# Patient Record
Sex: Female | Born: 1995 | Race: White | Hispanic: No | Smoking: Current every day smoker
Health system: Southern US, Community
[De-identification: ages and names within clinical notes are randomized; demographics above are authoritative.]

## PROBLEM LIST (undated history)

## (undated) DIAGNOSIS — N809 Endometriosis, unspecified: Secondary | ICD-10-CM

## (undated) DIAGNOSIS — N808 Other endometriosis: Secondary | ICD-10-CM

## (undated) DIAGNOSIS — N80A0 Endometriosis of bladder, unspecified depth: Secondary | ICD-10-CM

## (undated) DIAGNOSIS — J45909 Unspecified asthma, uncomplicated: Secondary | ICD-10-CM

## (undated) HISTORY — PX: ABDOMINAL HYSTERECTOMY: SHX81

## (undated) HISTORY — PX: APPENDECTOMY: SHX54

---
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
## 2016-02-06 DIAGNOSIS — R339 Retention of urine, unspecified: Secondary | ICD-10-CM | POA: Insufficient documentation

## 2017-07-14 DIAGNOSIS — E063 Autoimmune thyroiditis: Secondary | ICD-10-CM | POA: Insufficient documentation

## 2017-08-14 ENCOUNTER — Other Ambulatory Visit: Payer: Self-pay

## 2017-08-14 ENCOUNTER — Encounter: Payer: Self-pay | Admitting: Emergency Medicine

## 2017-08-14 ENCOUNTER — Emergency Department
Admission: EM | Admit: 2017-08-14 | Discharge: 2017-08-15 | Disposition: A | Discharge status: Home / Self Care | Payer: No Typology Code available for payment source | Attending: Emergency Medicine | Admitting: Emergency Medicine

## 2017-08-14 DIAGNOSIS — Z7721 Contact with and (suspected) exposure to potentially hazardous body fluids: Secondary | ICD-10-CM | POA: Insufficient documentation

## 2017-08-14 DIAGNOSIS — W273XXA Contact with needle (sewing), initial encounter: Secondary | ICD-10-CM

## 2017-08-14 DIAGNOSIS — W461XXA Contact with contaminated hypodermic needle, initial encounter: Secondary | ICD-10-CM | POA: Diagnosis not present

## 2017-08-14 DIAGNOSIS — S61031A Puncture wound without foreign body of right thumb without damage to nail, initial encounter: Secondary | ICD-10-CM | POA: Insufficient documentation

## 2017-08-14 DIAGNOSIS — Y990 Civilian activity done for income or pay: Secondary | ICD-10-CM | POA: Insufficient documentation

## 2017-08-14 DIAGNOSIS — F17210 Nicotine dependence, cigarettes, uncomplicated: Secondary | ICD-10-CM | POA: Insufficient documentation

## 2017-08-14 DIAGNOSIS — Y929 Unspecified place or not applicable: Secondary | ICD-10-CM | POA: Diagnosis not present

## 2017-08-14 DIAGNOSIS — S61239A Puncture wound without foreign body of unspecified finger without damage to nail, initial encounter: Secondary | ICD-10-CM

## 2017-08-14 DIAGNOSIS — Y93F9 Activity, other caregiving: Secondary | ICD-10-CM | POA: Insufficient documentation

## 2017-08-14 DIAGNOSIS — IMO0001 Reserved for inherently not codable concepts without codable children: Secondary | ICD-10-CM

## 2017-08-14 HISTORY — DX: Endometriosis, unspecified: N80.9

## 2017-08-14 NOTE — ED Triage Notes (Signed)
Pt arrives via [facility] for a right thumb stick. Pt was starting an IV and got her thumb. No visible puncture is noted to thumb at this time. Pt is in NAD.

## 2017-08-14 NOTE — Discharge Instructions (Addendum)
As we discussed, your occupational needle stick was low risk for infectious disease transmission.  We sent blood work both for you and the "source" patient according to protocol, and you declined prophylactic medication which I think is appropriate under these particular circumstances.  Please follow-up with your occupational health team as indicated by protocol.

## 2017-08-14 NOTE — ED Provider Notes (Signed)
[facility] Emergency Department Provider Note  ____________________________________________   First MD Initiated Contact with Patient 08/14/17 2301     (approximate)  I have reviewed the triage vital signs and the nursing notes.   HISTORY  Chief Complaint Body Fluid Exposure    HPI Autumn Bass is a 22 y.o. 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  The source packet for post exposure evaluation has been processed on the other patient; for example, the hepatitis panel is being sent to [facility] as per protocol.  Rapid HIV is pending.  [name] reports that she was in the back of the ambulance with the patient attempting to start an IV when she felt a mild needle prick in her right thumb.  There was no bleeding and there is no visible injury.  She reported to her team and they presented to the ED as per protocol.  She has no pain.  The exposure happened acutely.  The "source" is a healthy and active 22 year old woman who was coming in for evaluation of dizziness with minimal risk factors.  Past Medical History:  Diagnosis Date  . Endometriosis     There are no active problems to display for this patient.   Past Surgical History:  Procedure Laterality Date  . ABDOMINAL HYSTERECTOMY      Prior to Admission medications   Not on File    Allergies Benadryl [diphenhydramine]  No family history on file.  Social History Social History   Tobacco Use  . Smoking status: Current Every Day Smoker  . Smokeless tobacco: Never Used  Substance Use Topics  . Alcohol use: Yes    Frequency: Never  . Drug use: Never    Review of Systems Constitutional: No fever/chills Cardiovascular: Denies chest pain. Respiratory: Denies shortness of breath. Gastrointestinal: No abdominal pain.  No nausea, no vomiting.   Musculoskeletal: Negative for  neck pain.  Negative for back pain. Integumentary: No bleeding or visible injury to right thumb. Neurological: Negative for headaches, focal weakness or numbness.   ____________________________________________   PHYSICAL EXAM:  VITAL SIGNS: ED Triage Vitals  Enc Vitals Group     BP 08/14/17 2205 (!) 104/58     Pulse Rate 08/14/17 2205 77     Resp 08/14/17 2205 18     Temp 08/14/17 2205 97.7 F (36.5 C)     Temp Source 08/14/17 2205 Oral     SpO2 08/14/17 2205 95 %     Weight 08/14/17 2206 59 kg (130 lb)     Height 08/14/17 2206 1.6 m ( )     Head Circumference --      Peak Flow --      Pain Score 08/14/17 2206 0     Pain Loc --      Pain Edu? --      Excl. in GC? --     Constitutional: Alert and oriented. Well appearing and in no acute distress. Eyes: Conjunctivae are normal.  Head: Atraumatic. Cardiovascular: Normal rate, regular rhythm. Good peripheral circulation. Respiratory: Normal respiratory effort.  No retractions.  Musculoskeletal: No lower extremity tenderness nor edema. No gross deformities of extremities. Neurologic:  Normal speech and language. No gross focal neurologic deficits are appreciated.  Skin:  Skin is warm, dry and intact.  I examined her right thumb and I see no evidence of a puncture wound at this  time. Psychiatric: Mood and affect are normal. Speech and behavior are normal.  ____________________________________________   LABS (all labs ordered are listed, but only abnormal results are displayed)  Labs Reviewed - No data to display ____________________________________________  EKG  None - EKG not ordered by ED physician ____________________________________________  RADIOLOGY   ED MD interpretation: No indication for imaging  Official radiology report(s): No results found.  ____________________________________________   PROCEDURES  Critical Care performed: No   Procedure(s) performed:    Procedures   ____________________________________________   INITIAL IMPRESSION / ASSESSMENT AND PLAN / ED COURSE  As part of my medical decision making, I reviewed the following data within the electronic MEDICAL RECORD NUMBER Nursing notes reviewed and incorporated    I reviewed the "exposed" packet.  The patient does not want to start anti-viral medication which I think is very appropriate given the minimal exposure in the low risk source patient.  We proceeded with the blood draw to send to [facility] for HIV and hepatitis panel but did not send the blood work which the packet indicates is only necessary if the patient is starting prophylactic medication (CBC, CMP, urine pregnancy).  The patient is in no distress and after the lab work was finished she was discharged for follow-up with her occupational team.     ____________________________________________  FINAL CLINICAL IMPRESSION(S) / ED DIAGNOSES  Final diagnoses:  Needle stick injury of finger of right hand, initial encounter     MEDICATIONS GIVEN DURING THIS VISIT:  Medications - No data to display   ED Discharge Orders    None       Note:  This document was prepared using Dragon voice recognition software and may include unintentional dictation errors.    Loleta Rose, MD 08/15/17 [phone]

## 2017-10-06 ENCOUNTER — Ambulatory Visit: Payer: Self-pay | Admitting: Family Medicine

## 2017-10-06 VITALS — BP 114/56 | HR 76 | Temp 98.2°F | Resp 16

## 2017-10-06 DIAGNOSIS — B353 Tinea pedis: Secondary | ICD-10-CM

## 2017-10-06 MED ORDER — CLOTRIMAZOLE 1 % EX CREA: TOPICAL_CREAM | CUTANEOUS | 0 refills | Status: DC

## 2017-10-06 NOTE — Progress Notes (Signed)
Subjective: foot rash     Autumn Bass is a 22 y.o. female who presents for new evaluation and treatment of a rash of her left foot. Onset of symptoms was approximately 2 months ago. Patient reports the onset of her symptoms coincides with starting to wear very thick boots at work that trap moisture.  Location of tinea pedis: left foot between fourth and fifth toes. Associated findings include fissures, maceration and scaling.  Endorses a burning sensation.  Patient also reports noting a small lesion to the bottom of her foot 1 week ago that is painful to bear weight on.  Patient denies findings of central clearing of lesions, pruritus, pustules and vesicles. Treatment to date: over the counter antifungal medication.  Patient was using this occasionally as needed.  Patient reports significant improvement in her symptoms to the point where only a dry, scaling lesion was left but that a week ago symptoms worsened when she stopped using the cream prior to full resolution of the rash.  Patient reports a history of tinea pedis in the past, which she had seen podiatry for prior to moving to [place] recently.  Patient denies fever, chills, malaise, or any systemic symptoms.  Patient denies diabetes or any immunosuppressive conditions.  Review of Systems Pertinent items noted in HPI and remainder of comprehensive ROS otherwise negative.    Objective:     Skin:    Mild maceration noted between the fourth and fifth toes, well-defined borders and small superficial fissure.  No erythema, edema, warmth to the touch and the bleeding, odor, or drainage noted.  TTP.  Small red papule measuring approximately half a centimeter to plantar aspect of left foot near the distal aspect of the third metatarsal.  Small area of dry scaling noted to distal aspect. TTP.  Otherwise skin is clean, dry, and intact with no other lesions noted.               Assessment:    Tinea pedis     Plan:   See orders.  Discussed  side/adverse effects. Observe closely for skin damage/changes and contact us if worrisome changes occur.  Verbal patient instruction given. Unable to confirm the diagnosis in our clinic due to lacking diagnostic equipment.  Discussed this with patient.  Patient referred to podiatry to confirm the diagnosis and for follow-up. Discussed ways to keep her feet dry, prevent transmission, and prevent secondary infection. Red flag symptoms and indications to seek medical care discussed.  New Prescriptions   CLOTRIMAZOLE (LOTRIMIN) 1 % CREAM    Apply topically to affected area and to normal skin 2 cm beyond affected area BID until 7 days after symptom resolution, for up to 4 weeks.

## 2017-11-09 ENCOUNTER — Other Ambulatory Visit: Payer: Self-pay

## 2017-11-09 ENCOUNTER — Emergency Department: Payer: Managed Care, Other (non HMO)

## 2017-11-09 ENCOUNTER — Emergency Department
Admission: EM | Admit: 2017-11-09 | Discharge: 2017-11-09 | Disposition: A | Discharge status: Home / Self Care | Payer: Managed Care, Other (non HMO) | Attending: Student in an Organized Health Care Education/Training Program | Admitting: Student in an Organized Health Care Education/Training Program

## 2017-11-09 ENCOUNTER — Encounter: Payer: Self-pay | Admitting: Emergency Medicine

## 2017-11-09 DIAGNOSIS — R300 Dysuria: Secondary | ICD-10-CM

## 2017-11-09 DIAGNOSIS — Z91040 Latex allergy status: Secondary | ICD-10-CM | POA: Diagnosis not present

## 2017-11-09 DIAGNOSIS — F17200 Nicotine dependence, unspecified, uncomplicated: Secondary | ICD-10-CM | POA: Insufficient documentation

## 2017-11-09 DIAGNOSIS — R1031 Right lower quadrant pain: Secondary | ICD-10-CM

## 2017-11-09 HISTORY — DX: Endometriosis of bladder, unspecified depth: N80.A0

## 2017-11-09 HISTORY — DX: Other endometriosis: N80.8

## 2017-11-09 LAB — CBC
HEMATOCRIT: 37.8 % (ref 35.0–47.0)
HEMOGLOBIN: 13.1 g/dL (ref 12.0–16.0)
MCH: 32.3 pg (ref 26.0–34.0)
MCHC: 34.8 g/dL (ref 32.0–36.0)
MCV: 92.7 fL (ref 80.0–100.0)
Platelets: 254 10*3/uL (ref 150–440)
RBC: 4.08 MIL/uL (ref 3.80–5.20)
RDW: 12.9 % (ref 11.5–14.5)
WBC: 7.4 10*3/uL (ref 3.6–11.0)

## 2017-11-09 LAB — POCT PREGNANCY, URINE: PREG TEST UR: NEGATIVE

## 2017-11-09 LAB — BASIC METABOLIC PANEL
ANION GAP: 6 (ref 5–15)
BUN: 14 mg/dL (ref 6–20)
CALCIUM: 9.3 mg/dL (ref 8.9–10.3)
CO2: 24 mmol/L (ref 22–32)
Chloride: 109 mmol/L (ref 98–111)
Creatinine, Ser: 0.7 mg/dL (ref 0.44–1.00)
GFR calc Af Amer: >60 mL/min (ref >60)
Glucose, Bld: 89 mg/dL (ref 70–99)
Potassium: 4 mmol/L (ref 3.5–5.1)
SODIUM: 139 mmol/L (ref 135–145)

## 2017-11-09 LAB — URINALYSIS, COMPLETE (UACMP) WITH MICROSCOPIC
Bilirubin Urine: NEGATIVE
Glucose, UA: NEGATIVE mg/dL
Hgb urine dipstick: NEGATIVE
Ketones, ur: NEGATIVE mg/dL
Nitrite: NEGATIVE
PROTEIN: NEGATIVE mg/dL
Specific Gravity, Urine: 1.016 (ref 1.005–1.030)
pH: 5 (ref 5.0–8.0)

## 2017-11-09 MED ORDER — ONDANSETRON HCL 4 MG/2ML IJ SOLN
4.0000 mg | Freq: Once | INTRAMUSCULAR | Status: AC
  Administered 2017-11-09: 4 mg via INTRAVENOUS

## 2017-11-09 MED ORDER — ONDANSETRON HCL 4 MG/2ML IJ SOLN
INTRAMUSCULAR | Status: AC
  Filled 2017-11-09: qty 2

## 2017-11-09 MED ORDER — FENTANYL CITRATE (PF) 100 MCG/2ML IJ SOLN
50.0000 ug | Freq: Once | INTRAMUSCULAR | Status: AC
  Administered 2017-11-09: 50 ug via INTRAVENOUS

## 2017-11-09 MED ORDER — IBUPROFEN 600 MG PO TABS: 600.0000 mg | ORAL_TABLET | ORAL | Status: DC

## 2017-11-09 MED ORDER — PROMETHAZINE HCL 12.5 MG PO TABS: 12.5000 mg | ORAL_TABLET | Freq: Four times a day (QID) | ORAL | 0 refills | Status: DC | PRN

## 2017-11-09 MED ORDER — FENTANYL CITRATE (PF) 100 MCG/2ML IJ SOLN
INTRAMUSCULAR | Status: AC
  Filled 2017-11-09: qty 2

## 2017-11-09 MED ORDER — SODIUM CHLORIDE 0.9 % IV BOLUS
1000.0000 mL | Freq: Once | INTRAVENOUS | Status: AC
  Administered 2017-11-09: 1000 mL via INTRAVENOUS

## 2017-11-09 MED ORDER — IBUPROFEN 600 MG PO TABS
ORAL_TABLET | ORAL | Status: AC
  Filled 2017-11-09: qty 1

## 2017-11-09 MED ORDER — KETOROLAC TROMETHAMINE 30 MG/ML IJ SOLN
INTRAMUSCULAR | Status: AC
  Filled 2017-11-09: qty 1

## 2017-11-09 MED ORDER — KETOROLAC TROMETHAMINE 30 MG/ML IJ SOLN
15.0000 mg | Freq: Once | INTRAMUSCULAR | Status: AC
  Administered 2017-11-09: 15 mg via INTRAVENOUS
  Filled 2017-11-09: qty 1

## 2017-11-09 MED ORDER — CEPHALEXIN 500 MG PO CAPS: 500.0000 mg | ORAL_CAPSULE | Freq: Three times a day (TID) | ORAL | 0 refills | Status: AC

## 2017-11-09 NOTE — ED Notes (Signed)
Pt reports right flank pain for the 5 days, pt tearful in room.

## 2017-11-09 NOTE — ED Triage Notes (Signed)
Says right flank pain for about 4 days .  No fever.  Says feels different from uti.

## 2017-11-09 NOTE — ED Notes (Signed)
First Nurse Note: Patient given urine cup to collect specimen.

## 2017-11-09 NOTE — ED Notes (Signed)
Pt transported to US

## 2017-11-09 NOTE — ED Provider Notes (Signed)
-----------------------------------------   3:52 PM on 11/09/2017 -----------------------------------------  Patient's ultrasound is largely negative.  Patient status post hysterectomy left oophorectomy.  Urinalysis equivocal for urinary tract infection will cover with Keflex as a precaution.  Urine culture has been added onto the patient's urinalysis.  The remainder of the patient's lab work is largely nonrevealing the remainder the patient's imaging is largely nonrevealing.  Patient states her pain is controlled at home with Advil and essential oils.  Patient will be discharged I did discuss PCP follow-up as well as return precautions that the patient is not better in the next 2 to 3 days for consideration of possible CT imaging.   Minna AntisPaduchowski, Delrick Dehart, MD 11/09/17 1553

## 2017-11-09 NOTE — ED Provider Notes (Signed)
[facility] Emergency Department Provider Note    First MD Initiated Contact with Patient 11/09/17 1253     (approximate)  I have reviewed the triage vital signs and the nursing notes.   HISTORY  Chief Complaint Flank Pain    HPI Autumn Bass is a 22 y.o. 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  Initially thought it was related to UTI but as got more severe.  Patient woke up with moderate to severe pain radiating down into her right groin.  She status post appendectomy as well as hysterectomy but does still have her right ovary.  Does have a history of ovarian cyst but this is different and unrelated to pain that she has had related to ovarian cyst in the past.  Denies any diarrhea.  No fevers.  Does feel nauseated and like she needs to vomit secondary to the pain.    Past Medical History:  Diagnosis Date  . Endometriosis   . Endometriosis, bladder    bladder , colon , uterus , appendix   No family history on file. Past Surgical History:  Procedure Laterality Date  . ABDOMINAL HYSTERECTOMY     There are no active problems to display for this patient.     Prior to Admission medications   Medication Sig Start Date End Date Taking? Authorizing Provider  albuterol (PROVENTIL) (5 MG/ML) 0.5% nebulizer solution Inhale into the lungs.    [provider]  cephALEXin (KEFLEX) 500 MG capsule Take 1 capsule (500 mg total) by mouth 3 (three) times daily for 7 days. 11/09/17 11/16/17  Willy Eddy, MD  clotrimazole (LOTRIMIN) 1 % cream Apply topically to affected area and to normal skin 2 cm beyond affected area BID until 7 days after symptom resolution, for up to 4 weeks. 10/06/17   D'Jernes, Jasmine Pang, FNP  norelgestromin-ethinyl estradiol Burr Medico) 150-35 MCG/24HR transdermal patch Place onto the skin. 09/30/17 09/30/18  [provider]  promethazine (PHENERGAN) 12.5 MG tablet  Take 1 tablet (12.5 mg total) by mouth every 6 (six) hours as needed for nausea or vomiting. 11/09/17   Willy Eddy, MD    Allergies Doxycycline; Shellfish allergy; and Latex    Social History Social History   Tobacco Use  . Smoking status: Current Every Day Smoker  . Smokeless tobacco: Never Used  Substance Use Topics  . Alcohol use: Yes    Frequency: Never  . Drug use: Never    Review of Systems Patient denies headaches, rhinorrhea, blurry vision, numbness, shortness of breath, chest pain, edema, cough, abdominal pain, nausea, vomiting, diarrhea, dysuria, fevers, rashes or hallucinations unless otherwise stated above in HPI. ____________________________________________   PHYSICAL EXAM:  VITAL SIGNS: Vitals:   11/09/17 1104  BP: 105/62  Pulse: 79  Resp: 16  Temp: 98 F (36.7 C)  SpO2: 100%    Constitutional: Alert and oriented.  Eyes: Conjunctivae are normal.  Head: Atraumatic. Nose: No congestion/rhinnorhea. Mouth/Throat: Mucous membranes are moist.   Neck: No stridor. Painless ROM.  Cardiovascular: Normal rate, regular rhythm. Grossly normal heart sounds.  Good peripheral circulation. Respiratory: Normal respiratory effort.  No retractions. Lungs CTAB. Gastrointestinal: Soft and nontender. No distention. No abdominal bruits. No CVA tenderness. Genitourinary: deferred Musculoskeletal: No lower extremity tenderness nor edema.  No joint effusions. Neurologic:  Normal speech and language. No gross focal neurologic deficits are appreciated. No facial droop Skin:  Skin is warm, dry and  intact. No rash noted. Psychiatric: Mood and affect are normal. Speech and behavior are normal.  ____________________________________________   LABS (all labs ordered are listed, but only abnormal results are displayed)  Results for orders placed or performed during the hospital encounter of 11/09/17 (from the past 24 hour(s))  Urinalysis, Complete w Microscopic     Status:  Abnormal   Collection Time: 11/09/17 11:16 AM  Result Value Ref Range   Color, Urine YELLOW (A) YELLOW   APPearance HAZY (A) CLEAR   Specific Gravity, Urine 1.016 1.005 - 1.030   pH 5.0 5.0 - 8.0   Glucose, UA NEGATIVE NEGATIVE mg/dL   Hgb urine dipstick NEGATIVE NEGATIVE   Bilirubin Urine NEGATIVE NEGATIVE   Ketones, ur NEGATIVE NEGATIVE mg/dL   Protein, ur NEGATIVE NEGATIVE mg/dL   Nitrite NEGATIVE NEGATIVE   Leukocytes, UA TRACE (A) NEGATIVE   WBC, UA 0-5 0 - 5 WBC/hpf   Bacteria, UA RARE (A) NONE SEEN   Squamous Epithelial / LPF 6-10 0 - 5   Mucus PRESENT   Basic metabolic panel     Status: None   Collection Time: 11/09/17 11:16 AM  Result Value Ref Range   Sodium 139 135 - 145 mmol/L   Potassium 4.0 3.5 - 5.1 mmol/L   Chloride 109 98 - 111 mmol/L   CO2 24 22 - 32 mmol/L   Glucose, Bld 89 70 - 99 mg/dL   BUN 14 6 - 20 mg/dL   Creatinine, Ser 4.540.70 0.44 - 1.00 mg/dL   Calcium 9.3 8.9 - 09.810.3 mg/dL   GFR calc non Af Amer >60 >60 mL/min   GFR calc Af Amer >60 >60 mL/min   Anion gap 6 5 - 15  CBC     Status: None   Collection Time: 11/09/17 11:16 AM  Result Value Ref Range   WBC 7.4 3.6 - 11.0 K/uL   RBC 4.08 3.80 - 5.20 MIL/uL   Hemoglobin 13.1 12.0 - 16.0 g/dL   HCT 11.937.8 14.735.0 - 82.947.0 %   MCV 92.7 80.0 - 100.0 fL   MCH 32.3 26.0 - 34.0 pg   MCHC 34.8 32.0 - 36.0 g/dL   RDW 56.212.9 13.011.5 - 86.514.5 %   Platelets 254 150 - 440 K/uL  Pregnancy, urine POC     Status: None   Collection Time: 11/09/17 11:25 AM  Result Value Ref Range   Preg Test, Ur NEGATIVE NEGATIVE   ____________________________________________ ____________________________________________  RADIOLOGY  I personally reviewed all radiographic images ordered to evaluate for the above acute complaints and reviewed radiology reports and findings.  These findings were personally discussed with the patient.  Please see medical record for radiology  report.  ____________________________________________   PROCEDURES  Procedure(s) performed:  Procedures    Critical Care performed: no ____________________________________________   INITIAL IMPRESSION / ASSESSMENT AND PLAN / ED COURSE  Pertinent labs & imaging results that were available during my care of the patient were reviewed by me and considered in my medical decision making (see chart for details).   DDX: stone, pyelo, enteritis, msk strain, uti  Autumn Bass is a 22 y.o. who presents to the ED with symptoms as described above.  Patient is AFVSS in ED. Exam as above. Given current presentation have considered the above differential.  The patient will be placed on continuous pulse oximetry and telemetry for monitoring.  Laboratory evaluation will be sent to evaluate for the above complaints.  US ordered to eval for above differential  Clinical Course  as of Nov 09 1517  Mon Nov 09, 2017  1413 Patient reassessed.  Now complaining of worsening right lower quadrant pain around where her previous ovarian cyst is.  In light of normal ultrasound and severe pain requiring multiple doses of IV pain medication will ultrasound to evaluate for torsion or ruptured cyst.   [PR]    Clinical Course User Index [PR] Willy Eddyobinson, Ekam Besson, MD   Patient be signed out to oncoming physician pending follow-up of ovarian ultrasound.  Assuming no evidence of torsion.  Patient will be discharged on antibiotics for presumed UTI/Pilo.  Have discussed with the patient and available family all diagnostics and treatments performed thus far and all questions were answered to the best of my ability. The patient demonstrates understanding and agreement with plan.    As part of my medical decision making, I reviewed the following data within the electronic MEDICAL RECORD NUMBER Nursing notes reviewed and incorporated, Labs reviewed, notes from prior ED visits and [place] Controlled Substance  Database   ____________________________________________   FINAL CLINICAL IMPRESSION(S) / ED DIAGNOSES  Final diagnoses:  RLQ abdominal pain  Dysuria      NEW MEDICATIONS STARTED DURING THIS VISIT:  New Prescriptions   CEPHALEXIN (KEFLEX) 500 MG CAPSULE    Take 1 capsule (500 mg total) by mouth 3 (three) times daily for 7 days.   PROMETHAZINE (PHENERGAN) 12.5 MG TABLET    Take 1 tablet (12.5 mg total) by mouth every 6 (six) hours as needed for nausea or vomiting.     Note:  This document was prepared using Dragon voice recognition software and may include unintentional dictation errors.    Willy Eddyobinson, Oliviana Mcgahee, MD 11/09/17 [phone]

## 2017-11-19 ENCOUNTER — Emergency Department
Admission: EM | Admit: 2017-11-19 | Discharge: 2017-11-19 | Disposition: A | Discharge status: Home / Self Care | Payer: Managed Care, Other (non HMO) | Attending: Emergency Medicine | Admitting: Emergency Medicine

## 2017-11-19 ENCOUNTER — Telehealth: Payer: Self-pay

## 2017-11-19 ENCOUNTER — Emergency Department: Payer: Managed Care, Other (non HMO)

## 2017-11-19 ENCOUNTER — Other Ambulatory Visit: Payer: Self-pay

## 2017-11-19 ENCOUNTER — Encounter: Payer: Self-pay | Admitting: Emergency Medicine

## 2017-11-19 DIAGNOSIS — R079 Chest pain, unspecified: Secondary | ICD-10-CM

## 2017-11-19 DIAGNOSIS — F17200 Nicotine dependence, unspecified, uncomplicated: Secondary | ICD-10-CM | POA: Diagnosis not present

## 2017-11-19 DIAGNOSIS — R002 Palpitations: Secondary | ICD-10-CM

## 2017-11-19 LAB — BASIC METABOLIC PANEL
Anion gap: 8 (ref 5–15)
BUN: 12 mg/dL (ref 6–20)
CALCIUM: 9.3 mg/dL (ref 8.9–10.3)
CHLORIDE: 108 mmol/L (ref 98–111)
CO2: 24 mmol/L (ref 22–32)
CREATININE: 0.66 mg/dL (ref 0.44–1.00)
GFR calc non Af Amer: >60 mL/min (ref >60)
GLUCOSE: 80 mg/dL (ref 70–99)
Potassium: 3.7 mmol/L (ref 3.5–5.1)
Sodium: 140 mmol/L (ref 135–145)

## 2017-11-19 LAB — CBC
HCT: 38.3 % (ref 35.0–47.0)
Hemoglobin: 13.2 g/dL (ref 12.0–16.0)
MCH: 31.7 pg (ref 26.0–34.0)
MCHC: 34.6 g/dL (ref 32.0–36.0)
MCV: 91.7 fL (ref 80.0–100.0)
PLATELETS: 252 10*3/uL (ref 150–440)
RBC: 4.17 MIL/uL (ref 3.80–5.20)
RDW: 12.6 % (ref 11.5–14.5)
WBC: 8.7 10*3/uL (ref 3.6–11.0)

## 2017-11-19 LAB — TROPONIN I

## 2017-11-19 NOTE — Telephone Encounter (Signed)
Scheduled 10/17 added to waitlist

## 2017-11-19 NOTE — ED Triage Notes (Signed)
Pt to ED via POV with c/o intermit CP x3wks. Pt states she is possible in a junctional rhythm. PT states when CP episodes happen she becomes SOB and dizzy. NAD noted

## 2017-11-19 NOTE — ED Provider Notes (Signed)
[facility] Emergency Department Provider Note       Time seen: ----------------------------------------- 10:17 AM on 11/19/2017 -----------------------------------------   I have reviewed the triage vital signs and the nursing notes.  HISTORY   Chief Complaint Chest Pain    HPI Autumn Bass is a 22 y.o. female with a history of endometriosis who presents to the ED for chest pain.  Patient is describing periods of palpitations but also chest discomfort.  She states she has a strong cardiac history in her family, occasionally feels fluttering in her chest.  She denies any recent illness, does not take any medicine for her heart.  She quit smoking 3 weeks ago otherwise has no other cardiac risk factors.  She has been having chest pain for 3 weeks.  Past Medical History:  Diagnosis Date  . Endometriosis   . Endometriosis, bladder    bladder , colon , uterus , appendix    There are no active problems to display for this patient.   Past Surgical History:  Procedure Laterality Date  . ABDOMINAL HYSTERECTOMY      Allergies Doxycycline; Shellfish allergy; and Latex  Social History Social History   Tobacco Use  . Smoking status: Current Every Day Smoker  . Smokeless tobacco: Never Used  Substance Use Topics  . Alcohol use: Yes    Frequency: Never  . Drug use: Never   Review of Systems Constitutional: Negative for fever. Cardiovascular: Positive for chest pain and palpitations Respiratory: Negative for shortness of breath. Gastrointestinal: Negative for abdominal pain, vomiting and diarrhea. Musculoskeletal: Negative for back pain. Skin: Negative for rash. Neurological: Negative for headaches, focal weakness or numbness.  All systems negative/normal/unremarkable except as stated in the HPI  ____________________________________________   PHYSICAL EXAM:  VITAL SIGNS: ED Triage Vitals  Enc Vitals Group     BP 11/19/17 0826 115/60      Pulse Rate 11/19/17 0826 77     Resp 11/19/17 0826 16     Temp 11/19/17 0826 98.6 F (37 C)     Temp Source 11/19/17 0826 Oral     SpO2 11/19/17 0826 100 %     Weight 11/19/17 0823 135 lb (61.2 kg)     Height 11/19/17 0823 5\' 3"  (1.6 m)     Head Circumference --      Peak Flow --      Pain Score 11/19/17 0823 2     Pain Loc --      Pain Edu? --      Excl. in GC? --    Constitutional: Alert and oriented.  Widely anxious, no distress Eyes: Conjunctivae are normal. Normal extraocular movements. ENT   Head: Normocephalic and atraumatic.   Nose: No congestion/rhinnorhea.   Mouth/Throat: Mucous membranes are moist.   Neck: No stridor. Cardiovascular: Normal rate, regular rhythm. No murmurs, rubs, or gallops. Respiratory: Normal respiratory effort without tachypnea nor retractions. Breath sounds are clear and equal bilaterally. No wheezes/rales/rhonchi. Gastrointestinal: Soft and nontender. Normal bowel sounds Musculoskeletal: Nontender with normal range of motion in extremities. No lower extremity tenderness nor edema. Neurologic:  Normal speech and language. No gross focal neurologic deficits are appreciated.  Skin:  Skin is warm, dry and intact. No rash noted. Psychiatric: Mood and affect are normal. Speech and behavior are normal.  ____________________________________________  EKG: Interpreted by me.  Sinus rhythm with short PR, rate of 67 bpm, normal QRS, normal QT, normal axis  ____________________________________________  ED COURSE:  As part of my medical decision  making, I reviewed the following data within the electronic MEDICAL RECORD NUMBER History obtained from family if available, nursing notes, old chart and ekg, as well as notes from prior ED visits. Patient presented for chest pain and palpitations, we will assess with labs and imaging as indicated at this time.   Procedures ____________________________________________   LABS (pertinent  positives/negatives)  Labs Reviewed  BASIC METABOLIC PANEL  CBC  TROPONIN I  POC URINE PREG, ED    RADIOLOGY Images were viewed by me  Chest x-ray IMPRESSION: Mild right base subsegmental atelectasis, otherwise negative exam. ____________________________________________  DIFFERENTIAL DIAGNOSIS   Anxiety, GERD, arrhythmia, A. fib  FINAL ASSESSMENT AND PLAN  Palpitations   Plan: The patient had presented for palpitations. Patient's labs did not reveal any acute process. Patient's imaging is negative.  I have a low suspicion for concerning arrhythmia or ACS certainly, however we will place her on a Holter monitor and advise close outpatient cardiology follow-up.   Ulice Dash, MD   Note: This note was generated in part or whole with voice recognition software. Voice recognition is usually quite accurate but there are transcription errors that can and very often do occur. I apologize for any typographical errors that were not detected and corrected.     Emily Filbert, MD 11/19/17 1125

## 2017-11-19 NOTE — ED Notes (Signed)
Pt ambulatory to bathroom

## 2017-11-19 NOTE — ED Notes (Signed)
Pt reports not feeling well at this moment and feels fluttering in her chest. EKG ran. MD made aware

## 2017-11-19 NOTE — Telephone Encounter (Signed)
Just FYI   Patient called stating she was still in ER and states she was advised to make an appointment for follow up She stated she had a monitor even placed and that it was put on completley wrong.  Advised her to tell the nursing staff for they would fix it.   We then tried to schedule an appointment, our next appointment was Oct 17 th  She stated "I have to wait a month before being seen" advised her we would put her on the wait list so that any of the providers had an opening. She then proceeded to say she would call back and then schedule it.

## 2017-11-19 NOTE — ED Notes (Signed)
Pt very upset concerning discharge. Pt wanting to be admitted. Explained to pt that everything today was normal. Pt crying stating her follow up appointment cannot be until October. Informed pt she can come back to the emergency department as needed.

## 2017-11-19 NOTE — ED Notes (Addendum)
C/o pain to IV site and requesting it be discontinued.  MD and RN made aware.  IV discontinued from Right AC, no order to replace at this time.

## 2017-12-03 ENCOUNTER — Ambulatory Visit (INDEPENDENT_AMBULATORY_CARE_PROVIDER_SITE_OTHER): Payer: Managed Care, Other (non HMO)

## 2017-12-03 ENCOUNTER — Ambulatory Visit
Admission: RE | Admit: 2017-12-03 | Discharge: 2017-12-03 | Disposition: A | Discharge status: Home / Self Care | Payer: Managed Care, Other (non HMO) | Source: Ambulatory Visit | Attending: Emergency Medicine | Admitting: Emergency Medicine

## 2017-12-03 ENCOUNTER — Other Ambulatory Visit: Payer: Self-pay

## 2017-12-03 DIAGNOSIS — R002 Palpitations: Secondary | ICD-10-CM

## 2017-12-28 ENCOUNTER — Ambulatory Visit (INDEPENDENT_AMBULATORY_CARE_PROVIDER_SITE_OTHER): Payer: Managed Care, Other (non HMO)

## 2017-12-28 ENCOUNTER — Ambulatory Visit
Admission: EM | Admit: 2017-12-28 | Discharge: 2017-12-28 | Disposition: A | Discharge status: Home / Self Care | Payer: Managed Care, Other (non HMO) | Attending: Family Medicine | Admitting: Family Medicine

## 2017-12-28 ENCOUNTER — Encounter: Payer: Self-pay | Admitting: Emergency Medicine

## 2017-12-28 ENCOUNTER — Other Ambulatory Visit: Payer: Self-pay

## 2017-12-28 DIAGNOSIS — R05 Cough: Secondary | ICD-10-CM

## 2017-12-28 DIAGNOSIS — R0789 Other chest pain: Secondary | ICD-10-CM

## 2017-12-28 DIAGNOSIS — J302 Other seasonal allergic rhinitis: Secondary | ICD-10-CM

## 2017-12-28 DIAGNOSIS — R0602 Shortness of breath: Secondary | ICD-10-CM

## 2017-12-28 DIAGNOSIS — J4540 Moderate persistent asthma, uncomplicated: Secondary | ICD-10-CM

## 2017-12-28 HISTORY — DX: Unspecified asthma, uncomplicated: J45.909

## 2017-12-28 MED ORDER — PREDNISONE 20 MG PO TABS: 40.0000 mg | ORAL_TABLET | Freq: Every day | ORAL | 0 refills | Status: AC

## 2017-12-28 MED ORDER — ALBUTEROL SULFATE HFA 108 (90 BASE) MCG/ACT IN AERS: 2.0000 | INHALATION_SPRAY | Freq: Four times a day (QID) | RESPIRATORY_TRACT | 3 refills | Status: DC | PRN

## 2017-12-28 MED ORDER — FLUTICASONE PROPIONATE 50 MCG/ACT NA SUSP: 1.0000 | Freq: Every day | NASAL | 2 refills | Status: AC

## 2017-12-28 NOTE — Discharge Instructions (Signed)
-  Albuterol: Q 6 hours as needed -Prednisone: two tablets (40mg ) daily for 5 days -Flonase: recommend flonase in morning and taking Zyrtec at night -follow up with pulmonary asthma specialist

## 2017-12-28 NOTE — ED Triage Notes (Signed)
Patient stated she had an allergic reaction to something yesterday but she is unsure of what the trigger was. Patient c/o right ear pain and shortness of breath. Patient states she can't take in a deep breath. Patient stated she used her Albuterol inhaler 3 times yesterday and 1 time today.

## 2017-12-28 NOTE — ED Provider Notes (Signed)
[facility]    CSN: [id number] Arrival date & time: 12/28/17  1619     History   Chief Complaint Chief Complaint  Patient presents with  . Shortness of Breath    HPI Autumn Bass is a 22 y.o. female.   Patient is a 22 year old female who presents with complaint of shortness of breath.  Patient states she was visiting family in [place] yesterday when she had a allergy/asthma attack.  Patient reports she had runny nose, sneezing, sinus pressure and states her right eye was swollen.  Patient states she took some Benadryl last night, 100 mg and use her inhaler 3-4 times.  Patient reports she did have shortness of breath and some chest tightness.  Patient does report she does have history of admission for asthma and did have one intubation back when she was a child.  Patient states her symptoms do feel better today but states she is unable to take big deep breaths in her chest still feels a little tight.  Patient takes Zyrtec nightly and also has Flonase available.     Past Medical History:  Diagnosis Date  . Asthma   . Endometriosis   . Endometriosis, bladder    bladder , colon , uterus , appendix    There are no active problems to display for this patient.   Past Surgical History:  Procedure Laterality Date  . ABDOMINAL HYSTERECTOMY      OB History   None      Home Medications    Prior to Admission medications   Medication Sig Start Date End Date Taking? Authorizing Provider  albuterol (PROVENTIL) (5 MG/ML) 0.5% nebulizer solution Inhale into the lungs.   Yes [provider]  norelgestromin-ethinyl estradiol [facility]) 150-35 MCG/24HR transdermal patch Place onto the skin. 09/30/17 09/30/18 Yes [provider]  albuterol (PROVENTIL HFA;VENTOLIN HFA) 108 (90 Base) MCG/ACT inhaler Inhale 2 puffs into the lungs every 6 (six) hours as needed for wheezing or shortness of breath. 12/28/17   Candis Schatz, PA-C  fluticasone (FLONASE) 50 MCG/ACT  nasal spray Place 1 spray into both nostrils daily. 12/28/17   Candis Schatz, PA-C  predniSONE (DELTASONE) 20 MG tablet Take 2 tablets (40 mg total) by mouth daily with breakfast for 5 days. 12/28/17 01/02/18  Candis Schatz, PA-C    Family History History reviewed. No pertinent family history.  Social History Social History   Tobacco Use  . Smoking status: Current Every Day Smoker  . Smokeless tobacco: Never Used  Substance Use Topics  . Alcohol use: Yes    Frequency: Never  . Drug use: Never     Allergies   Doxycycline; Shellfish allergy; and Latex   Review of Systems Review of Systems as noted above HPI.  Other systems reviewed and found to be negative.   Physical Exam Triage Vital Signs ED Triage Vitals [12/28/17 1626]  Enc Vitals Group     BP 98/65     Pulse Rate 75     Resp 18     Temp 98.2 F (36.8 C)     Temp Source Oral     SpO2 100 %     Weight 127 lb (57.6 kg)     Height 5\' 3"  (1.6 m)     Head Circumference      Peak Flow      Pain Score 0     Pain Loc      Pain Edu?      Excl. in  GC?    No data found.  Updated Vital Signs BP 98/65 (BP Location: Left Arm)   Pulse 75   Temp 98.2 F (36.8 C) (Oral)   Resp 18   Ht 5\' 3"  (1.6 m)   Wt 127 lb (57.6 kg)   SpO2 100%   BMI 22.50 kg/m    Physical Exam  Constitutional: She is oriented to person, place, and time. She appears well-developed and well-nourished. She does not appear ill.  HENT:  Head: Normocephalic and atraumatic.  Eyes: Pupils are equal, round, and reactive to light. EOM are normal.  Neck: Normal range of motion.  Cardiovascular: Normal rate, regular rhythm and normal heart sounds.  Pulmonary/Chest: No bradypnea. No respiratory distress.  Breath sounds quiet with normal respiration.  With deep breath and forced expiration, patient has audible wheezing that is heard without a stethoscope.  In addition, patient's forceful expiration is diminished.  Abdominal: Soft. Bowel sounds  are normal.  Musculoskeletal: Normal range of motion.  Neurological: She is alert and oriented to person, place, and time. She is not disoriented.  Skin: Skin is warm and dry.     UC Treatments / Results  Labs (all labs ordered are listed, but only abnormal results are displayed) Labs Reviewed - No data to display  EKG None  Radiology Dg Chest 2 View  Result Date: 12/28/2017 CLINICAL DATA:  Chest tightness, cough and shortness of breath EXAM: CHEST - 2 VIEW COMPARISON:  11/19/2017 FINDINGS: The heart size and mediastinal contours are within normal limits. Both lungs are clear. The visualized skeletal structures are unremarkable. Left shoulder postop changes noted. Trachea is midline. Normal heart size and vascularity. No acute osseous finding. IMPRESSION: No active cardiopulmonary disease. Electronically Signed   By: Judie Petit.  Shick M.D.   On: 12/28/2017 17:08    Procedures Procedures (including critical care time)  Medications Ordered in UC Medications - No data to display  Initial Impression / Assessment and Plan / UC Course  I have reviewed the triage vital signs and the nursing notes.  Pertinent labs & imaging results that were available during my care of the patient were reviewed by me and considered in my medical decision making (see chart for details).     Patient with allergy/asthma symptoms that began yesterday.  Patient reports symptoms are better but she does feel occasional short of breath when she has a coughing fit.  Patient respirations are clear but her forced expiration is diminished with some wheezing noted.  We will give her a short course of steroids did not doubt her reaction/asthma symptoms.  Refill her albuterol.  Give her recommendation referral to pulmonology/asthma.  Final Clinical Impressions(s) / UC Diagnoses   Final diagnoses:  Shortness of breath  Moderate persistent asthma, unspecified whether complicated  Seasonal allergies     Discharge  Instructions     -Albuterol: Q 6 hours as needed -Prednisone: two tablets (40mg ) daily for 5 days -Flonase: recommend flonase in morning and taking Zyrtec at night -follow up with pulmonary asthma specialist    ED Prescriptions    Medication Sig Dispense Auth. Provider   albuterol (PROVENTIL HFA;VENTOLIN HFA) 108 (90 Base) MCG/ACT inhaler Inhale 2 puffs into the lungs every 6 (six) hours as needed for wheezing or shortness of breath. 1 Inhaler Candis Schatz, PA-C   predniSONE (DELTASONE) 20 MG tablet Take 2 tablets (40 mg total) by mouth daily with breakfast for 5 days. 10 tablet Candis Schatz, PA-C   fluticasone [facility]) 50  MCG/ACT nasal spray Place 1 spray into both nostrils daily. 16 g Candis Schatz, PA-C     Controlled Substance Prescriptions [place] Controlled Substance Registry consulted? Not Applicable   Candis Schatz, PA-C 12/28/17 1735

## 2018-01-14 ENCOUNTER — Ambulatory Visit: Payer: Self-pay | Admitting: Emergency Medicine

## 2018-01-14 ENCOUNTER — Encounter

## 2018-01-14 ENCOUNTER — Ambulatory Visit: Payer: Self-pay | Admitting: Cardiovascular Disease

## 2018-01-14 ENCOUNTER — Encounter: Payer: Self-pay | Admitting: Emergency Medicine

## 2018-01-14 VITALS — BP 112/78 | HR 79 | Temp 98.4°F | Resp 14 | Ht 63.0 in | Wt 137.0 lb

## 2018-01-14 DIAGNOSIS — S29012A Strain of muscle and tendon of back wall of thorax, initial encounter: Secondary | ICD-10-CM

## 2018-01-14 MED ORDER — CYCLOBENZAPRINE HCL 5 MG PO TABS: ORAL_TABLET | ORAL | 1 refills | Status: DC

## 2018-01-14 NOTE — Progress Notes (Signed)
Subjective:     Patient ID: Autumn Bass, female   DOB: 24-Mar-1996, 22 y.o.   MRN: [id number]  HPI Recalls no specific injury, but complains of 1 week of worsening right upper posterior back pain, especially around shoulder blade.  She describes it as sharp, without radiation.  Exacerbated by turning neck or twisting upper trunk or abducting right shoulder.  No associated weakness or numbness. No associated chest pain or shortness of breath or palpitations or syncope. She recalls that she had a massage about 5 days ago and the deep muscle massage may have exacerbated her symptoms. Then, the pain became severe, 7 out of 10 last night when she was trying to sleep, mainly under her right shoulder blade.  Denies midline spinal pain. Denies nausea or vomiting or fever or chills or abdominal pain or pelvic or GYN or GU symptoms.  No incontinence.  She states she is status post hysterectomy and unilateral oophorectomy in the past for severe endometriosis. She recently moved to [facility] and does not have PCP yet. She works as Museum/gallery exhibitions officer.  Of particular note, she has had multiple acute symptoms over the past 2 months, including feelings of chest pain and palpitations, which have completely resolved.  Work-up for those symptoms by cardiologist have been negative.   All within the past 2 months: Chest x-ray was normal, CT chest was normal, Holter monitor and EKG was within normal limits.  She had normal upper endoscopy for globus symptoms.  Review of Systems She had an asthma flareup, seen at med but in urgent care 12/28/2017, since been treated and all asthma symptoms have resolved.  Although she was prescribed prednisone at that visit, she did not fill it, as she states she got better without it.    Objective:   Physical Exam  Constitutional: She is oriented to person, place, and time. She appears well-developed and well-nourished. No distress.  HENT:  Head: Normocephalic and atraumatic.  Eyes: Pupils  are equal, round, and reactive to light. No scleral icterus.  Neck: Normal range of motion. Neck supple.  Cardiovascular: Normal rate, regular rhythm, normal heart sounds and intact distal pulses.  No murmur heard. Pulmonary/Chest: Effort normal and breath sounds normal. No stridor. No respiratory distress. She has no wheezes. She has no rales.  Abdominal: Soft. She exhibits no distension. There is no tenderness.  Musculoskeletal:       Right shoulder: She exhibits normal range of motion, no tenderness and no bony tenderness.       Cervical back: Normal. She exhibits normal range of motion, no tenderness and no bony tenderness.       Thoracic back: She exhibits tenderness (And spasm right parascapular muscles.  No midline tenderness) and spasm. She exhibits no bony tenderness and no swelling.       Lumbar back: Normal. She exhibits no tenderness and no bony tenderness.       Back:  Neurological: She is alert and oriented to person, place, and time. No cranial nerve deficit or sensory deficit.  Skin: Skin is warm and dry. No rash noted. She is not diaphoretic.  Psychiatric: She has a normal mood and affect. Her behavior is normal.  Vitals reviewed.      Assessment:     Muscular strain/pain of right parascapular muscles.  No evidence of bony involvement.  No evidence of cardiovascular cause. In my opinion, no testing or imaging indicated at this time.  Of note she has had extensive negative cardiovascular work-up, see HPI  Plan:     Treatment options discussed, as well as risks, benefits, alternatives. Patient voiced understanding and agreement with the following plans: Printed prescription given for Flexeril 5 mg. 15.  No refills.  1 or 2 at bedtime as needed muscle relaxant, but drowsiness precautions discussed. OTC ibuprofen up to 600 mg 3 times daily. Other symptomatic care discussed. See other details in AVS, which was printed and given to patient.  Questions invited and  answered. I urged her to establish with local PCP and names and phone numbers given. Red flags discussed and what to do if any red flag. She voiced understanding and agreement.

## 2018-01-14 NOTE — Patient Instructions (Signed)
You have a deep strain of the muscles around the right shoulder blade/upper back. No evidence of heart or lung or spine or neurologic cause for the pain. Treatment is heat, relative rest, OTC ibuprofen up to 600 mg 3 times a day with food, Flexeril at bedtime for muscle relaxant, and gentle passive range of motion exercises. I advised that you establish with and follow-up with PCP within the next 1 to 2 weeks, for recheck, and to help coordinate multiple medical problems.

## 2018-02-04 ENCOUNTER — Encounter: Payer: Self-pay | Admitting: Emergency Medicine

## 2018-02-04 ENCOUNTER — Ambulatory Visit: Payer: Self-pay | Admitting: Family Medicine

## 2018-02-04 ENCOUNTER — Ambulatory Visit
Admission: RE | Admit: 2018-02-04 | Discharge: 2018-02-04 | Disposition: A | Discharge status: Home / Self Care | Payer: Managed Care, Other (non HMO) | Source: Ambulatory Visit | Attending: Family Medicine | Admitting: Family Medicine

## 2018-02-04 ENCOUNTER — Ambulatory Visit
Admission: RE | Admit: 2018-02-04 | Discharge: 2018-02-04 | Disposition: A | Discharge status: Home / Self Care | Payer: Managed Care, Other (non HMO) | Source: Ambulatory Visit | Attending: Emergency Medicine | Admitting: Emergency Medicine

## 2018-02-04 ENCOUNTER — Ambulatory Visit: Payer: Self-pay

## 2018-02-04 VITALS — BP 122/72 | HR 88 | Temp 98.9°F | Resp 16

## 2018-02-04 DIAGNOSIS — R509 Fever, unspecified: Secondary | ICD-10-CM | POA: Diagnosis not present

## 2018-02-04 DIAGNOSIS — J209 Acute bronchitis, unspecified: Secondary | ICD-10-CM

## 2018-02-04 DIAGNOSIS — J029 Acute pharyngitis, unspecified: Secondary | ICD-10-CM

## 2018-02-04 DIAGNOSIS — R05 Cough: Secondary | ICD-10-CM | POA: Diagnosis present

## 2018-02-04 DIAGNOSIS — R5383 Other fatigue: Secondary | ICD-10-CM

## 2018-02-04 DIAGNOSIS — R059 Cough, unspecified: Secondary | ICD-10-CM

## 2018-02-04 LAB — POCT INFLUENZA A/B
Influenza A, POC: NEGATIVE
Influenza B, POC: NEGATIVE

## 2018-02-04 LAB — POCT RAPID STREP A (OFFICE): RAPID STREP A SCREEN: NEGATIVE

## 2018-02-04 MED ORDER — AZITHROMYCIN 250 MG PO TABS: ORAL_TABLET | ORAL | 0 refills | Status: DC

## 2018-02-04 NOTE — Progress Notes (Signed)
Subjective: cough, sore throat     Autumn Bass is a 22 y.o. female who presents for evaluation of productive cough with green sputum, sore throat, fatigue, nasal congestion, and fever (T-max 101 last night according to the patient).  Patient reports symptoms began 2 weeks ago but that in the last 3 to 4 days her sore throat and cough have worsened.  Reports overall symptoms have improved today however.  Also complains of left anterior lower lobe pain.  Patient also complains of "painful lymph nodes in my neck".  Reports a history of mononucleosis in the remote past.  Patient has a history of asthma but is not prescribed any daily medications for this.  Patient reports that in the winter she uses her albuterol inhaler almost daily.  Denies using her albuterol inhaler at all since the onset of this illness though.  Denies shortness of breath or wheezing. Reports a history of hospitalization and intubation as a child for asthma. Patient reports difficulty drinking and eating a lot yesterday but that she has been able to do this today and all the other days of her illness.  Patient feels that she is maintaining adequate hydration and nutrition. Treatment to date: NyQuil and Tylenol.  Patient reports the last time she took an antipyretic was earlier this morning. Denies rash, nausea, vomiting, diarrhea, SOB, wheezing, chest or back pain, ear pain, difficulty swallowing, confusion, anosmia/hyposmia, dental pain, facial pressure/unilateral facial pain, pain exacerbated by bending over, headache, body aches, ocular pruritis/discharge, or severe symptoms. History of smoking, asthma, COPD: Positive for asthma as described above.  Positive for tobacco use. Antibiotic use in the last 3 months: Negative.  Review of Systems Pertinent items noted in HPI and remainder of comprehensive ROS otherwise negative.     Objective:   Physical Exam General: Awake, alert, and oriented. No acute distress. Well developed, hydrated  and nourished. Appears stated age. Nontoxic appearance.  No signs of dehydration. HEENT:  PND noted. Mild erythema to posterior oropharynx. No edema or exudates of pharynx or tonsils. No erythema or bulging of TM. Mild erythema/edema to nasal mucosa. Sinuses nontender. Supple neck without adenopathy.  Moist mucous membranes. Cardiac: Heart rate and rhythm are normal. No murmurs, gallops, or rubs are auscultated. S1 and S2 are heard and are of normal intensity.  Respiratory: No signs of respiratory distress. Lungs clear. No tachypnea. Able to speak in full sentences without dyspnea. Nonlabored respirations.  Skin: Skin is warm, dry and intact. Appropriate color for ethnicity. No cyanosis noted.  Normal skin turgor.  Diagnostic Results:  Rapid flu test: Negative Rapid strep test: Negative  Assessment:    bronchitis   Plan:    Discussed diagnosis and treatment of bronchitis. Suggested symptomatic OTC remedies. Nasal saline spray for congestion.   Recommended patient present to the emergency department for evaluation if she is unable to eat or drink, unable to take care of herself, or feels that her symptoms are worsening.  Patient denies any of these and reports someone will be taking care of her at home. Prescribed azithromycin and discussed side/adverse effects. Advised patient to use her albuterol inhaler as needed.  Patient denies needing refill for this.  Provided patient with equipment for her nebulizer machine.  Advised patient that she needs to see her primary care provider soon as possible within the next two days to discuss gaining better control of her asthma and as follow-up. Discussed red flag symptoms and circumstances with which to seek medical care.   New  Prescriptions   AZITHROMYCIN (ZITHROMAX) 250 MG TABLET    Take 2 tablets on day one, then 1 tablet daily on days 2 through 5.

## 2018-02-07 ENCOUNTER — Other Ambulatory Visit: Payer: Self-pay

## 2018-02-07 DIAGNOSIS — F17200 Nicotine dependence, unspecified, uncomplicated: Secondary | ICD-10-CM | POA: Insufficient documentation

## 2018-02-07 DIAGNOSIS — Z79899 Other long term (current) drug therapy: Secondary | ICD-10-CM | POA: Insufficient documentation

## 2018-02-07 DIAGNOSIS — R000 Tachycardia, unspecified: Secondary | ICD-10-CM | POA: Insufficient documentation

## 2018-02-07 DIAGNOSIS — J209 Acute bronchitis, unspecified: Secondary | ICD-10-CM | POA: Insufficient documentation

## 2018-02-07 DIAGNOSIS — R05 Cough: Secondary | ICD-10-CM | POA: Diagnosis present

## 2018-02-07 DIAGNOSIS — Z91040 Latex allergy status: Secondary | ICD-10-CM | POA: Insufficient documentation

## 2018-02-07 DIAGNOSIS — J45909 Unspecified asthma, uncomplicated: Secondary | ICD-10-CM | POA: Insufficient documentation

## 2018-02-07 NOTE — ED Triage Notes (Signed)
Pt reports feeling poorly x 2 weeks; seen by employee health 4 days ago and taking Zithromax for bronchitis; not feeling any better; intermittent fevers; hoarse voice; dry, nagging cough; pt's HR 156 after walking in, settled to 112 after sitting for a few minutes;

## 2018-02-07 NOTE — ED Triage Notes (Signed)
Pt reports z-pack for 4 days and states what she is coughing up now is brown.

## 2018-02-08 ENCOUNTER — Emergency Department: Payer: Managed Care, Other (non HMO)

## 2018-02-08 ENCOUNTER — Emergency Department
Admission: EM | Admit: 2018-02-08 | Discharge: 2018-02-08 | Disposition: A | Discharge status: Home / Self Care | Payer: Managed Care, Other (non HMO) | Attending: Emergency Medicine | Admitting: Emergency Medicine

## 2018-02-08 ENCOUNTER — Encounter: Payer: Self-pay | Admitting: Radiology

## 2018-02-08 DIAGNOSIS — J209 Acute bronchitis, unspecified: Secondary | ICD-10-CM

## 2018-02-08 LAB — CBC WITH DIFFERENTIAL/PLATELET
Abs Immature Granulocytes: 0.02 10*3/uL (ref 0.00–0.07)
Basophils Absolute: 0.1 10*3/uL (ref 0.0–0.1)
Basophils Relative: 1 %
Eosinophils Absolute: 0.3 10*3/uL (ref 0.0–0.5)
Eosinophils Relative: 3 %
HCT: 38.9 % (ref 36.0–46.0)
Hemoglobin: 13.1 g/dL (ref 12.0–15.0)
Immature Granulocytes: 0 %
Lymphocytes Relative: 30 %
Lymphs Abs: 2.7 10*3/uL (ref 0.7–4.0)
MCH: 30.5 pg (ref 26.0–34.0)
MCHC: 33.7 g/dL (ref 30.0–36.0)
MCV: 90.5 fL (ref 80.0–100.0)
Monocytes Absolute: 0.5 10*3/uL (ref 0.1–1.0)
Monocytes Relative: 5 %
Neutro Abs: 5.6 10*3/uL (ref 1.7–7.7)
Neutrophils Relative %: 61 %
Platelets: 273 10*3/uL (ref 150–400)
RBC: 4.3 MIL/uL (ref 3.87–5.11)
RDW: 12.2 % (ref 11.5–15.5)
WBC: 9 10*3/uL (ref 4.0–10.5)
nRBC: 0 % (ref 0.0–0.2)

## 2018-02-08 LAB — HCG, QUANTITATIVE, PREGNANCY: hCG, Beta Chain, Quant, S: <1 m[IU]/mL (ref <5)

## 2018-02-08 LAB — BASIC METABOLIC PANEL
Anion gap: 10 (ref 5–15)
BUN: 9 mg/dL (ref 6–20)
CO2: 25 mmol/L (ref 22–32)
CREATININE: 0.58 mg/dL (ref 0.44–1.00)
Calcium: 9.8 mg/dL (ref 8.9–10.3)
Chloride: 105 mmol/L (ref 98–111)
GFR calc Af Amer: >60 mL/min (ref >60)
GFR calc non Af Amer: >60 mL/min (ref >60)
GLUCOSE: 95 mg/dL (ref 70–99)
Potassium: 3.5 mmol/L (ref 3.5–5.1)
Sodium: 140 mmol/L (ref 135–145)

## 2018-02-08 LAB — FIBRIN DERIVATIVES D-DIMER (ARMC ONLY): Fibrin derivatives D-dimer (ARMC): 499.69 ng/mL (FEU) — ABNORMAL HIGH (ref 0.00–499.00)

## 2018-02-08 MED ORDER — DIPHENHYDRAMINE HCL 50 MG/ML IJ SOLN
50.0000 mg | Freq: Once | INTRAMUSCULAR | Status: AC
  Administered 2018-02-08: 50 mg via INTRAVENOUS
  Filled 2018-02-08: qty 1

## 2018-02-08 MED ORDER — PREDNISONE 20 MG PO TABS: 40.0000 mg | ORAL_TABLET | Freq: Every day | ORAL | 0 refills | Status: DC

## 2018-02-08 MED ORDER — NAPROXEN 500 MG PO TABS: 500.0000 mg | ORAL_TABLET | Freq: Two times a day (BID) | ORAL | 0 refills | Status: DC

## 2018-02-08 MED ORDER — IPRATROPIUM-ALBUTEROL 0.5-2.5 (3) MG/3ML IN SOLN
3.0000 mL | Freq: Once | RESPIRATORY_TRACT | Status: AC
  Administered 2018-02-08: 3 mL via RESPIRATORY_TRACT
  Filled 2018-02-08: qty 3

## 2018-02-08 MED ORDER — RACEPINEPHRINE HCL 2.25 % IN NEBU
0.5000 mL | INHALATION_SOLUTION | Freq: Once | RESPIRATORY_TRACT | Status: AC
  Administered 2018-02-08: 0.5 mL via RESPIRATORY_TRACT
  Filled 2018-02-08: qty 0.5

## 2018-02-08 MED ORDER — METHYLPREDNISOLONE SODIUM SUCC 125 MG IJ SOLR
125.0000 mg | Freq: Once | INTRAMUSCULAR | Status: AC
  Administered 2018-02-08: 125 mg via INTRAVENOUS
  Filled 2018-02-08: qty 2

## 2018-02-08 MED ORDER — ALBUTEROL SULFATE (2.5 MG/3ML) 0.083% IN NEBU
5.0000 mg | INHALATION_SOLUTION | Freq: Once | RESPIRATORY_TRACT | Status: AC
  Administered 2018-02-08: 5 mg via RESPIRATORY_TRACT
  Filled 2018-02-08: qty 6

## 2018-02-08 MED ORDER — KETOROLAC TROMETHAMINE 30 MG/ML IJ SOLN
15.0000 mg | INTRAMUSCULAR | Status: AC
  Administered 2018-02-08: 15 mg via INTRAVENOUS
  Filled 2018-02-08: qty 1

## 2018-02-08 MED ORDER — IOPAMIDOL (ISOVUE-370) INJECTION 76%
75.0000 mL | Freq: Once | INTRAVENOUS | Status: AC | PRN
  Administered 2018-02-08: 04:00:00 via INTRAVENOUS

## 2018-02-08 MED ORDER — SODIUM CHLORIDE 0.9 % IV BOLUS
1000.0000 mL | Freq: Once | INTRAVENOUS | Status: AC
  Administered 2018-02-08: 1000 mL via INTRAVENOUS

## 2018-02-08 MED ORDER — ALBUTEROL SULFATE HFA 108 (90 BASE) MCG/ACT IN AERS: 2.0000 | INHALATION_SPRAY | RESPIRATORY_TRACT | 0 refills | Status: AC | PRN

## 2018-02-08 MED ORDER — ALBUTEROL SULFATE HFA 108 (90 BASE) MCG/ACT IN AERS: 2.0000 | INHALATION_SPRAY | RESPIRATORY_TRACT | 0 refills | Status: DC | PRN

## 2018-02-08 MED ORDER — ONDANSETRON HCL 4 MG/2ML IJ SOLN
4.0000 mg | Freq: Once | INTRAMUSCULAR | Status: AC
  Administered 2018-02-08: 4 mg via INTRAVENOUS
  Filled 2018-02-08: qty 2

## 2018-02-08 NOTE — Discharge Instructions (Addendum)
Your CT scans today do not show any blood clots in the lungs or pneumonia.  Take steroids and anti-inflammatory medicine to control your symptoms.  Albuterol inhaler can help as well.  Follow-up with primary care.  The CT scan does find a small nodule in your thyroid which can be further evaluated by your doctor with an ultrasound. CT summary: 12 mm right thyroid nodule, indeterminate. Further evaluation  with dedicated thyroid ultrasound recommended. This could be  performed on a nonemergent basis.

## 2018-02-08 NOTE — ED Notes (Signed)
Patient transported to CT 

## 2018-02-08 NOTE — ED Provider Notes (Signed)
[facility] Emergency Department Provider Note  ____________________________________________  Time seen: Approximately 4:14 AM  I have reviewed the triage vital signs and the nursing notes.   HISTORY  Chief Complaint Cough; Hoarse; and Fever    HPI Autumn Bass is a 22 y.o. female with a history of asthma, endometriosis, anxiety who complains of hoarse voice, nonproductive cough, shortness of breath for the past 5 days.  She was treated with a Z-Pak without improvement and she feels worse.  No fever.  Positive chills.  Denies chest pain.  Symptoms are constant, waxing waning, no aggravating or alleviating factors      Past Medical History:  Diagnosis Date  . Asthma   . Endometriosis   . Endometriosis, bladder    bladder , colon , uterus , appendix     Patient Active Problem List   Diagnosis Date Noted  . Urinary retention 02/06/2016  . Hx of migraine headaches 09/10/2013  . Intractable headache 09/10/2013  . Abnormal uterine bleeding 07/22/2013  . 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 Asthma 02/11/2013  . Endometriosis 02/11/2013  . Hypoglycemia 02/11/2013     Past Surgical History:  Procedure Laterality Date  . ABDOMINAL HYSTERECTOMY       Prior to Admission medications   Medication Sig Start Date End Date Taking? Authorizing Provider  acetaminophen (TYLENOL) 500 MG tablet Take by mouth.    [provider]  albuterol (PROVENTIL HFA) 108 (90 Base) MCG/ACT inhaler Inhale 2 puffs into the lungs every 4 (four) hours as needed for wheezing or shortness of breath. 02/08/18   Sharman Cheek, MD  albuterol (PROVENTIL HFA;VENTOLIN HFA) 108 (90 Base) MCG/ACT inhaler Inhale 2 puffs into the lungs every 6 (six) hours as needed for wheezing or shortness of breath. 12/28/17   Candis Schatz, PA-C  albuterol (PROVENTIL) (5 MG/ML) 0.5% nebulizer solution Inhale into the lungs.    [provider]  azithromycin (ZITHROMAX) 250 MG tablet  Take 2 tablets on day one, then 1 tablet daily on days 2 through 5. 02/04/18   McManama, Richardson Dopp, FNP  cetirizine (ZYRTEC) 10 MG tablet Take by mouth.    [provider]  dicyclomine (BENTYL) 10 MG capsule Take 10 mg by mouth as directed.    [provider]  fluticasone (FLONASE) 50 MCG/ACT nasal spray Place 1 spray into both nostrils daily. 12/28/17   Candis Schatz, PA-C  Lubiprostone (AMITIZA PO) Take by mouth.    [provider]  naproxen (NAPROSYN) 500 MG tablet Take 1 tablet (500 mg total) by mouth 2 (two) times daily with a meal. 02/08/18   Sharman Cheek, MD  predniSONE (DELTASONE) 20 MG tablet Take 2 tablets (40 mg total) by mouth daily. 02/08/18   Sharman Cheek, MD     Allergies Gluten meal; Doxycycline; Avocado; Shellfish allergy; Diphenhydramine hcl; Iodinated diagnostic agents; and Latex   No family history on file.  Social History Social History   Tobacco Use  . Smoking status: Current Every Day Smoker  . Smokeless tobacco: Never Used  Substance Use Topics  . Alcohol use: Yes    Frequency: Never  . Drug use: Never    Review of Systems  Constitutional:   No fever or chills.  ENT:   Positive sore throat. No rhinorrhea. Cardiovascular:   No chest pain or syncope. Respiratory: Positive shortness of breath and nonproductive cough. Gastrointestinal:   Negative for abdominal pain, vomiting and diarrhea.  Musculoskeletal:   Negative for focal pain or swelling All  other systems reviewed and are negative except as documented above in ROS and HPI.  ____________________________________________   PHYSICAL EXAM:  VITAL SIGNS: ED Triage Vitals  Enc Vitals Group     BP 02/07/18 2301 126/70     Pulse Rate 02/07/18 2257 (!) 112     Resp 02/07/18 2257 19     Temp 02/07/18 2257 (!) 97.5 F (36.4 C)     Temp Source 02/07/18 2257 Oral     SpO2 02/07/18 2257 99 %     Weight 02/07/18 2302 130 lb (59 kg)     Height 02/07/18 2302 5\' 3"   (1.6 m)     Head Circumference --      Peak Flow --      Pain Score 02/07/18 2306 6     Pain Loc --      Pain Edu? --      Excl. in GC? --     Vital signs reviewed, nursing assessments reviewed.   Constitutional:   Alert and oriented. Non-toxic appearance. Eyes:   Conjunctivae are normal. EOMI. PERRL. ENT      Head:   Normocephalic and atraumatic.  TMs normal bilaterally      Nose:   No congestion/rhinnorhea.       Mouth/Throat:   MMM, no pharyngeal erythema. No peritonsillar mass.       Neck:   No meningismus. Full ROM. Hematological/Lymphatic/Immunilogical:   No cervical lymphadenopathy. Cardiovascular:   Tachycardia heart rate 110. Symmetric bilateral radial and DP pulses.  No murmurs. Cap refill less than 2 seconds. Respiratory:   Normal respiratory effort without tachypnea/retractions.  Diffuse expiratory wheezing.   Gastrointestinal:   Soft and nontender. Non distended. There is no CVA tenderness.  No rebound, rigidity, or guarding. Musculoskeletal:   Normal range of motion in all extremities. No joint effusions.  No lower extremity tenderness.  No edema. Neurologic:   Normal speech and language.  Motor grossly intact. No acute focal neurologic deficits are appreciated.  Skin:    Skin is warm, dry and intact. No rash noted.  No petechiae, purpura, or bullae.  ____________________________________________    LABS (pertinent positives/negatives) (all labs ordered are listed, but only abnormal results are displayed) Labs Reviewed  FIBRIN DERIVATIVES D-DIMER ([facility] ONLY) - Abnormal; Notable for the following components:      Result Value   Fibrin derivatives D-dimer [facility]) 499.69 (*)    All other components within normal limits  BASIC METABOLIC PANEL  CBC WITH DIFFERENTIAL/PLATELET  HCG, QUANTITATIVE, PREGNANCY   ____________________________________________   EKG    ____________________________________________    RADIOLOGY  Dg Neck Soft Tissue  Result Date:  02/08/2018 CLINICAL DATA:  Fever. EXAM: NECK SOFT TISSUES - 1+ VIEW COMPARISON:  None. FINDINGS: Visualized cervical spine is normal. The prevertebral soft tissues are normal. The epiglottis is not well assessed but does not appear to be markedly thickened. No other acute abnormalities. IMPRESSION: The epiglottis is not well assessed as there is little air in the vallecula. It would be difficult to exclude mild thickening of the epiglottis but there is not gross thickening. Recommend clinical correlation. Electronically Signed   By: Gerome Sam III M.D   On: 02/08/2018 00:58   Dg Chest 2 View  Result Date: 02/08/2018 CLINICAL DATA:  Intermittent fevers.  Hoarseness.  Cough. EXAM: CHEST - 2 VIEW COMPARISON:  February 04, 2018 FINDINGS: The heart size and mediastinal contours are within normal limits. Both lungs are clear. The visualized skeletal structures are unremarkable. IMPRESSION:  No active cardiopulmonary disease. Electronically Signed   By: Gerome Sam III M.D   On: 02/08/2018 00:57   Ct Soft Tissue Neck W Contrast  Result Date: 02/08/2018 CLINICAL DATA:  Initial evaluation for acute sore throat, stridor, epiglottitis or tonsillitis suspected. EXAM: CT NECK WITH CONTRAST TECHNIQUE: Multidetector CT imaging of the neck was performed using the standard protocol following the bolus administration of intravenous contrast. CONTRAST:  <See Chart> ISOVUE-370 IOPAMIDOL (ISOVUE-370) INJECTION 76% COMPARISON:  None available. FINDINGS: Pharynx and larynx: Oral cavity within normal limits without mass lesion or loculated fluid collection. No acute abnormality about the dentition. Palatine tonsils symmetric and within normal limits. Parapharyngeal fat maintained. Nasopharynx normal. No retropharyngeal collection. Epiglottis normal. Vallecula largely effaced by the lingual tonsils. There is mild asymmetric mucosal edema involving the left hypopharynx near the level of the piriform sinus, which could  reflect mild acute pharyngitis (series 5, image 47). Remainder of the hypopharynx and supraglottic larynx within normal limits. True cords symmetric and normal. Subglottic airway clear. Salivary glands: Salivary glands including the parotid and submandibular glands within normal limits. Thyroid: 12 mm well-circumscribed hypodense right thyroid nodule noted. Thyroid otherwise unremarkable. Lymph nodes: No pathologically enlarged lymph nodes identified within the neck. Vascular: Normal intravascular enhancement seen throughout the neck. Limited intracranial: Unremarkable. Visualized orbits: Unremarkable. 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 Superimposed small air-fluid levels within the right maxillary and sphenoid sinuses suggest acute on chronic exacerbation. Mastoid air cells and middle ear cavities are well pneumatized and free of fluid. Skeleton: No acute osseus abnormality. No discrete lytic or blastic osseous lesions. Upper chest: Visualized upper chest demonstrates no acute finding. Other: None. IMPRESSION: 1. Subtle mild asymmetric mucosal edema within the left pharynx as above, suggesting mild acute pharyngitis given the history of sore throat. No abscess or drainable fluid collection identified. No evidence for acute epiglottitis. 2. 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 Superimposed small air-fluid levels within the right maxillary and sphenoid sinuses suggest acute on chronic exacerbation. 3. 12 mm right thyroid nodule, indeterminate. Further evaluation with dedicated thyroid ultrasound recommended. This could be performed on a nonemergent basis. Electronically Signed   By: Rise Mu M.D.   On: 02/08/2018 04:30   Ct Angio Chest Pe W And/or Wo Contrast  Result Date: 02/08/2018 CLINICAL DATA:  Initial evaluation for possible  pulmonary embolism, recently treated for bronchitis. EXAM: CT ANGIOGRAPHY CHEST WITH CONTRAST TECHNIQUE: Multidetector CT imaging of the chest was performed using the standard protocol during bolus administration of intravenous contrast. Multiplanar CT image reconstructions and MIPs were obtained to evaluate the vascular anatomy. CONTRAST:  <See Chart> ISOVUE-370 IOPAMIDOL (ISOVUE-370) INJECTION 76% COMPARISON:  Prior radiograph from earlier the same day. FINDINGS: Cardiovascular: Intrathoracic aorta of normal caliber in appearance without acute abnormality. Visualized great vessels within normal limits. Heart size normal. No pericardial effusion. Pulmonary arterial tree adequately opacified for evaluation. Main pulmonary artery within normal limits for caliber. No filling defect to suggest acute pulmonary embolism. Re-formatted imaging confirms these findings. Mediastinum/Nodes: Visualized thyroid unremarkable. No enlarged mediastinal, hilar, or axillary lymph nodes identified. Normal residual thymic tissue noted within the anterior mediastinum. Esophagus within normal limits. Lungs/Pleura: Tracheobronchial tree intact and widely patent. Lungs well inflated bilaterally. No focal infiltrates. No pulmonary edema or pleural effusion. No pneumothorax. No worrisome pulmonary nodule or mass. Upper Abdomen: Visualized upper abdomen demonstrates no acute finding. Musculoskeletal: External soft tissues demonstrate no  acute finding. No acute osseus abnormality. No worrisome lytic or blastic osseous lesions. Review of the MIP images confirms the above findings. IMPRESSION: 1. No CT evidence for acute pulmonary embolism. 2. No other acute cardiopulmonary abnormality identified. Electronically Signed   By: Rise Mu M.D.   On: 02/08/2018 04:50    ____________________________________________   PROCEDURES Procedures  ____________________________________________  DIFFERENTIAL DIAGNOSIS   Epiglottitis,  tonsillitis, viral respiratory infection, bronchitis, low suspicion pulmonary embolism  CLINICAL IMPRESSION / ASSESSMENT AND PLAN / ED COURSE  Pertinent labs & imaging results that were available during my care of the patient were reviewed by me and considered in my medical decision making (see chart for details).    Patient presents with shortness of breath, worsening despite Z-Pak use.  Exam is suggestive of bronchitis or other airway inflammatory disease, most likely viral.  Check chest x-ray and neck x-ray.  Check labs.  Bronchodilators, steroids.  Clinical Course as of Feb 08 499  Mon Feb 08, 2018  0151 Wheezing somewhat improved but still persistent, still frequent coughing and posttussive emesis.  Voice is still hoarse but not stridorous.  For continued symptom relief I will give Zofran, Toradol, trial of racemic epinephrine nebulizer.D-dimer came back just above the discriminatory level.  She has a mild reaction to IV contrast, already had Solu-Medrol.  I will plan to premedicate with Benadryl and obtain a CT scan in a safe interval.   [PS]  0354 Patient feels much better.  Wheezing resolved, coughing resolved.  Symptomatically very much improved.  Tachycardia to 120 with normal oxygenation and blood pressure.  Tachycardia may be due to racemic epinephrine use.  I will follow-up CT scan.   [PS]    Clinical Course User Index [PS] Sharman Cheek, MD    ----------------------------------------- 5:00 AM on 02/08/2018 -----------------------------------------  CT is unremarkable.  No PE or significant upper airway swelling.  No adverse reaction after the IV contrast.  Stable for discharge home and follow-up with primary care.   ____________________________________________   FINAL CLINICAL IMPRESSION(S) / ED DIAGNOSES    Final diagnoses:  Acute bronchitis, unspecified organism     ED Discharge Orders         Ordered    predniSONE (DELTASONE) 20 MG tablet  Daily      02/08/18 0459    albuterol (PROVENTIL HFA) 108 (90 Base) MCG/ACT inhaler  Every 4 hours PRN     02/08/18 0459    naproxen (NAPROSYN) 500 MG tablet  2 times daily with meals     02/08/18 0459          Portions of this note were generated with dragon dictation software. Dictation errors may occur despite best attempts at proofreading.    Sharman Cheek, MD 02/08/18 0500

## 2018-02-08 NOTE — ED Notes (Signed)
Patient reports seen on 11/7 for same symptoms reports not any better.  Patient speaking in very hoarse, raspy voice.  Patient has non productive cough.

## 2018-03-30 ENCOUNTER — Encounter: Payer: Self-pay | Admitting: Emergency Medicine

## 2018-03-30 ENCOUNTER — Ambulatory Visit: Payer: Self-pay | Admitting: Emergency Medicine

## 2018-03-30 VITALS — BP 108/62 | HR 94 | Temp 98.4°F | Resp 14

## 2018-03-30 DIAGNOSIS — J029 Acute pharyngitis, unspecified: Secondary | ICD-10-CM

## 2018-03-30 DIAGNOSIS — J020 Streptococcal pharyngitis: Secondary | ICD-10-CM

## 2018-03-30 LAB — POCT RAPID STREP A (OFFICE): Rapid Strep A Screen: POSITIVE — AB

## 2018-03-30 LAB — POCT INFLUENZA A/B
Influenza A, POC: NEGATIVE
Influenza B, POC: NEGATIVE

## 2018-03-30 MED ORDER — AMOXICILLIN 400 MG/5ML PO SUSR: ORAL | 0 refills | Status: DC

## 2018-03-30 MED ORDER — DEXAMETHASONE SODIUM PHOSPHATE 10 MG/ML IJ SOLN
10.0000 mg | INTRAMUSCULAR | Status: AC
  Administered 2018-03-30: 10 mg via INTRAMUSCULAR

## 2018-03-30 NOTE — Patient Instructions (Addendum)
You have severe strep pharyngitis.-Positive rapid strep test.  (Negative flu test), with some swelling in the back of your throat, but no sign of airway blockage. Treatment: Dexamethasone 10 mg intramuscular shot now to help with throat swelling. Prescription sent to your pharmacy for amoxicillin liquid. You need to rest and drink plenty of fluids.  Tylenol or ibuprofen for pain or fever. You need to stay home from work today, and possibly tomorrow.  May return to work if you are fever free for 24 hours and feeling better. Please read attached instruction sheet on strep throat. Strep Throat  Strep throat is a bacterial infection of the throat. Your health care provider may call the infection tonsillitis or pharyngitis, depending on whether there is swelling in the tonsils or at the back of the throat. Strep throat is most common during the cold months of the year in children who are 745-615 years of age, but it can happen during any season in people of any age. This infection is spread from person to person (contagious) through coughing, sneezing, or close contact. What are the causes? Strep throat is caused by the bacteria called Streptococcus pyogenes. What increases the risk? This condition is more likely to develop in:  People who spend time in crowded places where the infection can spread easily.  People who have close contact with someone who has strep throat. What are the signs or symptoms? Symptoms of this condition include:  Fever or chills.  Redness, swelling, or pain in the tonsils or throat.  Pain or difficulty when swallowing.  White or yellow spots on the tonsils or throat.  Swollen, tender glands in the neck or under the jaw.  Red rash all over the body (rare). How is this diagnosed? This condition is diagnosed by performing a rapid strep test or by taking a swab of your throat (throat culture test). Results from a rapid strep test are usually ready in a few minutes, but  throat culture test results are available after one or two days. How is this treated? This condition is treated with antibiotic medicine. Follow these instructions at home: Medicines  Take over-the-counter and prescription medicines only as told by your health care provider.  Take your antibiotic as told by your health care provider. Do not stop taking the antibiotic even if you start to feel better.  Have family members who also have a sore throat or fever tested for strep throat. They may need antibiotics if they have the strep infection. Eating and drinking  Do not share food, drinking cups, or personal items that could cause the infection to spread to other people.  If swallowing is difficult, try eating soft foods until your sore throat feels better.  Drink enough fluid to keep your urine clear or pale yellow. General instructions  Gargle with a salt-water mixture 3-4 times per day or as needed. To make a salt-water mixture, completely dissolve -1 tsp of salt in 1 cup of warm water.  Make sure that all household members wash their hands well.  Get plenty of rest.  Stay home from school or work until you have been taking antibiotics for 24 hours.  Keep all follow-up visits as told by your health care provider. This is important. Contact a health care provider if:  The glands in your neck continue to get bigger.  You develop a rash, cough, or earache.  You cough up a thick liquid that is green, yellow-brown, or bloody.  You have pain or  discomfort that does not get better with medicine.  Your problems seem to be getting worse rather than better.  You have a fever. Get help right away if:  You have new symptoms, such as vomiting, severe headache, stiff or painful neck, chest pain, or shortness of breath.  You have severe throat pain, drooling, or changes in your voice.  You have swelling of the neck, or the skin on the neck becomes red and tender.  You have signs  of dehydration, such as fatigue, dry mouth, and decreased urination.  You become increasingly sleepy, or you cannot wake up completely.  Your joints become red or painful. This information is not intended to replace advice given to you by your health care provider. Make sure you discuss any questions you have with your health care provider. Document Released: 03/14/2000 Document Revised: 11/14/2015 Document Reviewed: 07/10/2014 Elsevier Interactive Patient Education  [employer]2019 [employer] Inc[employer]

## 2018-03-30 NOTE — Progress Notes (Signed)
 [facility]   Patient ID: Odette FractionMegan Bartok DOB: 22 y.o. MRN: [id number]   Subjective: Chief complaint: SORE THROAT Onset: Yesterday    Severity: moderate Tried OTC Tylenol and ibuprofen which helped fever and pain a little.  Symptoms:  + Fever  + Swollen, painful neck glands No known recent Strep Exposure. Of note, she had tonsillectomy years ago for recurrent strep tonsillitis.     No Myalgias No Headache No Rash  No Discolored Nasal Mucus No Allergy symptoms No sinus pain/pressure No itchy/red eyes No earache  Very mild drooling No trismus Has decreased appetite, but is tolerating liquids. No Nausea No Vomiting No Abdominal pain No Diarrhea No Reflux symptoms  No Cough No Breathing Difficulty No Shortness of Breath No pleuritic pain No Wheezing No Hemoptysis  Objective: Blood pressure 108/62, pulse 94, temperature 98.4 F (36.9 C), temperature source Oral, resp. rate 14, SpO2 100 %.  In general, alert but very fatigued and ill appearing.  Cooperative.  She can ambulate.  Appears uncomfortable from sore throat but no cardiorespiratory distress.  Does not appear toxic. TMs normal Nose normal Oropharynx: Very red inflamed edematous posterior pharynx.  Tonsils surgically absent.  Airway intact.  No other lesions.  No exudate. Neck supple with very tender enlarged bilateral anterior cervical nodes.  No posterior cervical adenopathy. Lungs: Clear Heart: Regular rate and rhythm without murmur Abdomen: Soft, nontender without masses Skin: No rash  Rapid strep test positive Rapid flu test (done at her request) negative    Assessment: Very severe strep pharyngitis with mild edema posterior pharynx but airway intact.   Plan:  Treatment options discussed, as well as risks, benefits, alternatives. Patient voiced understanding and agreement with the following plans:  Dexamethasone 10 mg IM stat-aggressively treating  posterior pharynx edema.  Precautions discussed at length.   New Prescriptions   AMOXICILLIN (AMOXIL) 400 MG/5ML SUSPENSION    10 mL's by mouth twice a day for 10 days.  Take for the full 10 days.     We do not have antibiotic injection here in our facility, and she declined the option of going elsewhere for shot of Bicillin or Rocephin.  AVS printed and reviewed and discussed: You have severe strep pharyngitis.-Positive rapid strep test.  (Negative flu test), with some swelling in the back of your throat, but no sign of airway blockage. Treatment: Dexamethasone 10 mg intramuscular shot now to help with throat swelling. Prescription sent to your pharmacy for amoxicillin liquid. You need to rest and drink plenty of fluids.  Tylenol or ibuprofen for pain or fever. You need to stay home from work today, and possibly tomorrow.  May return to work if you are fever free for 24 hours and feeling better. Please read attached instruction sheet on strep throat. Follow-up with your primary care doctor in 5-7 days if not improving, or sooner if symptoms become worse. Precautions discussed. Red flags discussed.-Emergency room if any red flag Questions invited and answered. Patient voiced understanding and agreement.

## 2018-05-15 ENCOUNTER — Emergency Department: Payer: Managed Care, Other (non HMO)

## 2018-05-15 ENCOUNTER — Encounter: Payer: Self-pay | Admitting: Emergency Medicine

## 2018-05-15 ENCOUNTER — Other Ambulatory Visit: Payer: Self-pay

## 2018-05-15 ENCOUNTER — Emergency Department
Admission: EM | Admit: 2018-05-15 | Discharge: 2018-05-16 | Disposition: A | Discharge status: Home / Self Care | Payer: Managed Care, Other (non HMO) | Attending: Emergency Medicine | Admitting: Emergency Medicine

## 2018-05-15 DIAGNOSIS — R1010 Upper abdominal pain, unspecified: Secondary | ICD-10-CM | POA: Diagnosis present

## 2018-05-15 DIAGNOSIS — Z79899 Other long term (current) drug therapy: Secondary | ICD-10-CM | POA: Insufficient documentation

## 2018-05-15 DIAGNOSIS — Z91040 Latex allergy status: Secondary | ICD-10-CM | POA: Diagnosis not present

## 2018-05-15 DIAGNOSIS — J45909 Unspecified asthma, uncomplicated: Secondary | ICD-10-CM | POA: Diagnosis not present

## 2018-05-15 DIAGNOSIS — F17200 Nicotine dependence, unspecified, uncomplicated: Secondary | ICD-10-CM | POA: Diagnosis not present

## 2018-05-15 DIAGNOSIS — K529 Noninfective gastroenteritis and colitis, unspecified: Secondary | ICD-10-CM | POA: Diagnosis not present

## 2018-05-15 LAB — COMPREHENSIVE METABOLIC PANEL
ALT: 20 U/L (ref 0–44)
AST: 21 U/L (ref 15–41)
Albumin: 4.2 g/dL (ref 3.5–5.0)
Alkaline Phosphatase: 64 U/L (ref 38–126)
Anion gap: 9 (ref 5–15)
BUN: 23 mg/dL — ABNORMAL HIGH (ref 6–20)
CO2: 19 mmol/L — ABNORMAL LOW (ref 22–32)
Calcium: 8.8 mg/dL — ABNORMAL LOW (ref 8.9–10.3)
Chloride: 109 mmol/L (ref 98–111)
Creatinine, Ser: 0.77 mg/dL (ref 0.44–1.00)
GFR calc Af Amer: >60 mL/min (ref >60)
GFR calc non Af Amer: >60 mL/min (ref >60)
Glucose, Bld: 116 mg/dL — ABNORMAL HIGH (ref 70–99)
Potassium: 3.9 mmol/L (ref 3.5–5.1)
Sodium: 137 mmol/L (ref 135–145)
Total Bilirubin: 0.7 mg/dL (ref 0.3–1.2)
Total Protein: 7.2 g/dL (ref 6.5–8.1)

## 2018-05-15 LAB — CBC
HCT: 41.7 % (ref 36.0–46.0)
Hemoglobin: 13.9 g/dL (ref 12.0–15.0)
MCH: 30.5 pg (ref 26.0–34.0)
MCHC: 33.3 g/dL (ref 30.0–36.0)
MCV: 91.4 fL (ref 80.0–100.0)
NRBC: 0 % (ref 0.0–0.2)
PLATELETS: 253 10*3/uL (ref 150–400)
RBC: 4.56 MIL/uL (ref 3.87–5.11)
RDW: 12.6 % (ref 11.5–15.5)
WBC: 12.4 10*3/uL — AB (ref 4.0–10.5)

## 2018-05-15 LAB — LIPASE, BLOOD: Lipase: 20 U/L (ref 11–51)

## 2018-05-15 MED ORDER — FENTANYL CITRATE (PF) 100 MCG/2ML IJ SOLN
50.0000 ug | INTRAMUSCULAR | Status: DC | PRN
  Administered 2018-05-15: 50 ug via INTRAVENOUS
  Filled 2018-05-15: qty 2

## 2018-05-15 MED ORDER — ONDANSETRON HCL 4 MG/2ML IJ SOLN
INTRAMUSCULAR | Status: AC
  Filled 2018-05-15: qty 2

## 2018-05-15 MED ORDER — MORPHINE SULFATE (PF) 4 MG/ML IV SOLN
INTRAVENOUS | Status: AC
  Filled 2018-05-15: qty 1

## 2018-05-15 MED ORDER — SODIUM CHLORIDE 0.9% FLUSH: 3.0000 mL | Freq: Once | INTRAVENOUS | Status: DC

## 2018-05-15 MED ORDER — MORPHINE SULFATE (PF) 4 MG/ML IV SOLN
4.0000 mg | Freq: Once | INTRAVENOUS | Status: AC
  Administered 2018-05-15: 4 mg via INTRAVENOUS

## 2018-05-15 MED ORDER — ONDANSETRON HCL 4 MG/2ML IJ SOLN
4.0000 mg | Freq: Once | INTRAMUSCULAR | Status: AC
  Administered 2018-05-15: 4 mg via INTRAVENOUS
  Filled 2018-05-15: qty 2

## 2018-05-15 MED ORDER — ONDANSETRON HCL 4 MG/2ML IJ SOLN
4.0000 mg | Freq: Once | INTRAMUSCULAR | Status: AC
  Administered 2018-05-15: 4 mg via INTRAVENOUS

## 2018-05-15 MED ORDER — MORPHINE SULFATE (PF) 4 MG/ML IV SOLN
4.0000 mg | Freq: Once | INTRAVENOUS | Status: AC
  Administered 2018-05-15: 4 mg via INTRAVENOUS
  Filled 2018-05-15: qty 1

## 2018-05-15 MED ORDER — PROMETHAZINE HCL 25 MG/ML IJ SOLN
12.5000 mg | Freq: Once | INTRAMUSCULAR | Status: AC
  Administered 2018-05-15: 12.5 mg via INTRAVENOUS

## 2018-05-15 MED ORDER — PROMETHAZINE HCL 12.5 MG PO TABS: 12.5000 mg | ORAL_TABLET | Freq: Four times a day (QID) | ORAL | 0 refills | Status: AC | PRN

## 2018-05-15 NOTE — ED Notes (Signed)
Pt to CT at this time.

## 2018-05-15 NOTE — ED Notes (Signed)
Pt to US at this time.

## 2018-05-15 NOTE — ED Triage Notes (Signed)
Epigastric pain and vomiting started this am.

## 2018-05-15 NOTE — ED Provider Notes (Signed)
[facility] Emergency Department Provider Note   ____________________________________________    I have reviewed the triage vital signs and the nursing notes.   HISTORY  Chief Complaint Abdominal Pain     HPI Autumn Bass is a 23 y.o. female who presents with complaints of moderate to severe abdominal pain.  Patient describes epigastric and right upper quadrant pain that started yesterday evening after eating and gradually became worse today.  She describes it as burning.  She also reports nausea vomiting and diarrhea, nonbilious nonbloody.  No reports of fevers or chills.  No myalgias.  No sick contacts.  No recent travel.  Has not take anything for this.  Epigastric pain radiates to her back.  Has not had this before  Past Medical History:  Diagnosis Date  . Asthma   . Endometriosis   . Endometriosis, bladder    bladder , colon , uterus , appendix    Patient Active Problem List   Diagnosis Date Noted  . Urinary retention 02/06/2016  . Hx of migraine headaches 09/10/2013  . Intractable headache 09/10/2013  . Abnormal uterine bleeding 07/22/2013  . 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 Asthma 02/11/2013  . Endometriosis 02/11/2013  . Hypoglycemia 02/11/2013    Past Surgical History:  Procedure Laterality Date  . ABDOMINAL HYSTERECTOMY    . APPENDECTOMY      Prior to Admission medications   Medication Sig Start Date End Date Taking? Authorizing Provider  acetaminophen (TYLENOL) 500 MG tablet Take by mouth.    [provider]  albuterol (PROVENTIL HFA) 108 (90 Base) MCG/ACT inhaler Inhale 2 puffs into the lungs every 4 (four) hours as needed for wheezing or shortness of breath. 02/08/18   Sharman Cheek, MD  albuterol (PROVENTIL) (5 MG/ML) 0.5% nebulizer solution Inhale into the lungs.    [provider]  amoxicillin (AMOXIL) 400 MG/5ML suspension 10 mL's by mouth twice a day for 10 days.  Take for the full 10 days.  03/30/18   Lajean Manes, MD  cetirizine (ZYRTEC) 10 MG tablet Take by mouth.    [provider]  cyclobenzaprine (FLEXERIL) 5 MG tablet  01/14/18   [provider]  dicyclomine (BENTYL) 10 MG capsule Take 10 mg by mouth as directed.    [provider]  fluticasone (FLONASE) 50 MCG/ACT nasal spray Place 1 spray into both nostrils daily. 12/28/17   Candis Schatz, PA-C  linaclotide [facility]) 145 MCG CAPS capsule Take by mouth. 02/23/18   [provider]  Lubiprostone (AMITIZA PO) Take by mouth.    [provider]  naproxen (NAPROSYN) 500 MG tablet Take 1 tablet (500 mg total) by mouth 2 (two) times daily with a meal. 02/08/18   Sharman Cheek, MD  polyethylene glycol-electrolytes (NULYTELY/GOLYTELY) 420 g solution May substitute with GaviLyte G Nulytely TriLyte Take as directed. 02/15/18   [provider]  promethazine (PHENERGAN) 12.5 MG tablet Take 1 tablet (12.5 mg total) by mouth every 6 (six) hours as needed for nausea or vomiting. 05/15/18   Jene Every, MD     Allergies Gluten meal; Doxycycline; Avocado; Shellfish allergy; Iodinated diagnostic agents; and Latex  No family history on file.  Social History Social History   Tobacco Use  . Smoking status: Current Every Day Smoker  . Smokeless tobacco: Never Used  Substance Use Topics  . Alcohol use: Yes    Frequency: Never  . Drug use: Never    Review of Systems  Constitutional: No fever/chills Eyes: No  visual changes.  ENT: No sore throat. Cardiovascular: Denies chest pain. Respiratory: Denies shortness of breath. Gastrointestinal: No abdominal pain.  No nausea, no vomiting.   Genitourinary: Negative for dysuria. Musculoskeletal: Negative for back pain. Skin: Negative for rash. Neurological: Negative for headaches or weakness   ____________________________________________   PHYSICAL EXAM:  VITAL SIGNS: ED Triage Vitals  Enc Vitals Group     BP 05/15/18  1841 111/80     Pulse Rate 05/15/18 1841 (!) 120     Resp 05/15/18 1841 18     Temp 05/15/18 1841 98 F (36.7 C)     Temp Source 05/15/18 1841 Oral     SpO2 05/15/18 1841 96 %     Weight 05/15/18 1842 63 kg (139 lb)     Height 05/15/18 1842 1.6 m (5\' 3" )     Head Circumference --      Peak Flow --      Pain Score 05/15/18 1842 10     Pain Loc --      Pain Edu? --      Excl. in GC? --     Constitutional: Alert and oriented.   Nose: No congestion/rhinnorhea. Mouth/Throat: Mucous membranes are moist.    Cardiovascular: Normal rate, regular rhythm. Grossly normal heart sounds.  Good peripheral circulation. Respiratory: Normal respiratory effort.  No retractions. Lungs CTAB. Gastrointestinal: Tenderness palpation the right upper quadrant primarily, some epigastric discomfort, no distention, no CVA tenderness  Musculoskeletal: .  Warm and well perfused Neurologic:  Normal speech and language. No gross focal neurologic deficits are appreciated.  Skin:  Skin is warm, dry and intact. No rash noted. Psychiatric: Mood and affect are normal. Speech and behavior are normal.  ____________________________________________   LABS (all labs ordered are listed, but only abnormal results are displayed)  Labs Reviewed  CBC - Abnormal; Notable for the following components:      Result Value   WBC 12.4 (*)    All other components within normal limits  COMPREHENSIVE METABOLIC PANEL - Abnormal; Notable for the following components:   CO2 19 (*)    Glucose, Bld 116 (*)    BUN 23 (*)    Calcium 8.8 (*)    All other components within normal limits  LIPASE, BLOOD   ____________________________________________  EKG  ED ECG REPORT I, Jene Everyobert Eligio Angert, the attending physician, personally viewed and interpreted this ECG.  Date: 05/15/2018  Rhythm: normal sinus rhythm QRS Axis: normal Intervals: Short PR ST/T Wave abnormalities: normal Narrative Interpretation: no evidence of acute  ischemia  ____________________________________________  RADIOLOGY  Ultrasound negative for cholecystitis ____________________________________________   PROCEDURES  Procedure(s) performed: No  Procedures   Critical Care performed: No ____________________________________________   INITIAL IMPRESSION / ASSESSMENT AND PLAN / ED COURSE  Pertinent labs & imaging results that were available during my care of the patient were reviewed by me and considered in my medical decision making (see chart for details).  Patient presents with nausea vomiting diarrhea and abdominal pain.  She does have significant tenderness in epigastrium and right upper quadrants.  Lab work is pending, treated with IV fentanyl with brief relief but she reports pain is coming back.  Differential includes viral gastroenteritis, cholecystitis, pancreatitis, gastritis, PUD   We will give IV morphine, IV Zofran, obtain ultrasound and reevaluate  Ultrasound negative for cholecystitis.  Mild elevation white blood cell count is nonspecific.  Pending lipase  Lab work overall reassuring.  CT abdomen pelvis negative for acute abnormality.  We will try  Phenergan for nausea  ----------------------------------------- 11:14 PM on 05/15/2018 -----------------------------------------  Patient feels markedly improved, reviewed imaging with her, appropriate this time for discharge, will Rx Phenergan      ____________________________________________   FINAL CLINICAL IMPRESSION(S) / ED DIAGNOSES  Final diagnoses:  Upper abdominal pain  Gastroenteritis        Note:  This document was prepared using Dragon voice recognition software and may include unintentional dictation errors.   Jene Every, MD 05/15/18 2314

## 2018-07-15 ENCOUNTER — Ambulatory Visit: Payer: Managed Care, Other (non HMO) | Admitting: Adult Health

## 2018-07-15 ENCOUNTER — Other Ambulatory Visit: Payer: Self-pay

## 2018-07-15 DIAGNOSIS — R399 Unspecified symptoms and signs involving the genitourinary system: Secondary | ICD-10-CM

## 2018-07-15 MED ORDER — AMOXICILLIN-POT CLAVULANATE 500-125 MG PO TABS: 1.0000 | ORAL_TABLET | Freq: Two times a day (BID) | ORAL | 0 refills | Status: AC

## 2018-07-15 NOTE — Patient Instructions (Addendum)
I discussed the limitations of evaluation and management by telemedicine and the availability of in person appointments. The patient expressed understanding and agreed to proceed.  Based on your symptoms we will treat for urinary tract infection. If any symptoms change or worsen at anytime or not improving seek emergency medical care immediately. I also recommend follow up with your gynecologist within 1-2 weeks given right ovary still remains and given your history and sooner if needed.  Advised patient call the office or your primary care doctor for an appointment if no improvement within 72 hours or if any symptoms change or worsen at any time  Advised ER or urgent Care if after hours or on weekend. Call 911 for emergency symptoms at any time.Patinet verbalized understanding of all instructions given/reviewed and treatment plan and has no further questions or concerns at this time.     Urinary Tract Infection, Adult A urinary tract infection (UTI) is an infection of any part of the urinary tract. The urinary tract includes:  The kidneys.  The ureters.  The bladder.  The urethra. These organs make, store, and get rid of pee (urine) in the body. What are the causes? This is caused by germs (bacteria) in your genital area. These germs grow and cause swelling (inflammation) of your urinary tract. What increases the risk? You are more likely to develop this condition if:  You have a small, thin tube (catheter) to drain pee.  You cannot control when you pee or poop (incontinence).  You are female, and: ? You use these methods to prevent pregnancy: ? A medicine that kills sperm (spermicide). ? A device that blocks sperm (diaphragm). ? You have low levels of a female hormone (estrogen). ? You are pregnant.  You have genes that add to your risk.  You are sexually active.  You take antibiotic medicines.  You have trouble peeing because of: ? A prostate that is bigger than normal, if  you are female. ? A blockage in the part of your body that drains pee from the bladder (urethra). ? A kidney stone. ? A nerve condition that affects your bladder (neurogenic bladder). ? Not getting enough to drink. ? Not peeing often enough.  You have other conditions, such as: ? Diabetes. ? A weak disease-fighting system (immune system). ? Sickle cell disease. ? Gout. ? Injury of the spine. What are the signs or symptoms? Symptoms of this condition include:  Needing to pee right away (urgently).  Peeing often.  Peeing small amounts often.  Pain or burning when peeing.  Blood in the pee.  Pee that smells bad or not like normal.  Trouble peeing.  Pee that is cloudy.  Fluid coming from the vagina, if you are female.  Pain in the belly or lower back. Other symptoms include:  Throwing up (vomiting).  No urge to eat.  Feeling mixed up (confused).  Being tired and grouchy (irritable).  A fever.  Watery poop (diarrhea). How is this treated? This condition may be treated with:  Antibiotic medicine.  Other medicines.  Drinking enough water. Follow these instructions at home:  Medicines  Take over-the-counter and prescription medicines only as told by your doctor.  If you were prescribed an antibiotic medicine, take it as told by your doctor. Do not stop taking it even if you start to feel better. General instructions  Make sure you: ? Pee until your bladder is empty. ? Do not hold pee for a long time. ? Empty your bladder after  sex. ? Wipe from front to back after pooping if you are a female. Use each tissue one time when you wipe.  Drink enough fluid to keep your pee pale yellow.  Keep all follow-up visits as told by your doctor. This is important. Contact a doctor if:  You do not get better after 1-2 days.  Your symptoms go away and then come back. Get help right away if:  You have very bad back pain.  You have very bad pain in your lower  belly.  You have a fever.  You are sick to your stomach (nauseous).  You are throwing up. Summary  A urinary tract infection (UTI) is an infection of any part of the urinary tract.  This condition is caused by germs in your genital area.  There are many risk factors for a UTI. These include having a small, thin tube to drain pee and not being able to control when you pee or poop.  Treatment includes antibiotic medicines for germs.  Drink enough fluid to keep your pee pale yellow. This information is not intended to replace advice given to you by your health care provider. Make sure you discuss any questions you have with your health care provider. Document Released: 09/03/2007 Document Revised: 09/24/2017 Document Reviewed: 09/24/2017 Elsevier Interactive Patient Education  2019 Elsevier Inc. Pelvic Pain, Female Pelvic pain is pain in your lower belly (abdomen), below your belly button and between your hips. The pain may start suddenly (be acute), keep coming back (be recurring), or last a long time (become chronic). Pelvic pain that lasts longer than 6 months is called chronic pelvic pain. There are many causes of pelvic pain. Sometimes the cause of pelvic pain is not known. Follow these instructions at home:   Take over-the-counter and prescription medicines only as told by your doctor.  Rest as told by your doctor.  Do not have sex if it hurts.  Keep a journal of your pelvic pain. Write down: ? When the pain started. ? Where the pain is located. ? What seems to make the pain better or worse, such as food or your period (menstrual cycle). ? Any symptoms you have along with the pain.  Keep all follow-up visits as told by your doctor. This is important. Contact a doctor if:  Medicine does not help your pain.  Your pain comes back.  You have new symptoms.  You have unusual discharge or bleeding from your vagina.  You have a fever or chills.  You are having trouble  pooping (constipation).  You have blood in your pee (urine) or poop (stool).  Your pee smells bad.  You feel weak or light-headed. Get help right away if:  You have sudden pain that is very bad.  Your pain keeps getting worse.  You have very bad pain and also have any of these symptoms: ? A fever. ? Feeling sick to your stomach (nausea). ? Throwing up (vomiting). ? Being very sweaty.  You pass out (lose consciousness). Summary  Pelvic pain is pain in your lower belly (abdomen), below your belly button and between your hips.  There are many possible causes of pelvic pain.  Keep a journal of your pelvic pain. This information is not intended to replace advice given to you by your health care provider. Make sure you discuss any questions you have with your health care provider. Document Released: 09/03/2007 Document Revised: 09/02/2017 Document Reviewed: 09/02/2017 Elsevier Interactive Patient Education  2019 Elsevier Inc. Amoxicillin; Clavulanic  Acid tablets What is this medicine? AMOXICILLIN; CLAVULANIC ACID (a mox i SIL in; KLAV yoo lan ic AS id) is a penicillin antibiotic. It is used to treat certain kinds of bacterial infections. It will not work for colds, flu, or other viral infections. This medicine may be used for other purposes; ask your health care provider or pharmacist if you have questions. COMMON BRAND NAME(S): Augmentin What should I tell my health care provider before I take this medicine? They need to know if you have any of these conditions: -bowel disease, like colitis -kidney disease -liver disease -mononucleosis -an unusual or allergic reaction to amoxicillin, penicillin, cephalosporin, other antibiotics, clavulanic acid, other medicines, foods, dyes, or preservatives -pregnant or trying to get pregnant -breast-feeding How should I use this medicine? Take this medicine by mouth with a full glass of water. Follow the directions on the prescription  label. Take at the start of a meal. Do not crush or chew. If the tablet has a score line, you may cut it in half at the score line for easier swallowing. Take your medicine at regular intervals. Do not take your medicine more often than directed. Take all of your medicine as directed even if you think you are better. Do not skip doses or stop your medicine early. Talk to your pediatrician regarding the use of this medicine in children. Special care may be needed. Overdosage: If you think you have taken too much of this medicine contact a poison control center or emergency room at once. NOTE: This medicine is only for you. Do not share this medicine with others. What if I miss a dose? If you miss a dose, take it as soon as you can. If it is almost time for your next dose, take only that dose. Do not take double or extra doses. What may interact with this medicine? -allopurinol -anticoagulants -birth control pills -methotrexate -probenecid This list may not describe all possible interactions. Give your health care provider a list of all the medicines, herbs, non-prescription drugs, or dietary supplements you use. Also tell them if you smoke, drink alcohol, or use illegal drugs. Some items may interact with your medicine. What should I watch for while using this medicine? Tell your doctor or health care professional if your symptoms do not improve. Do not treat diarrhea with over the counter products. Contact your doctor if you have diarrhea that lasts more than 2 days or if it is severe and watery. If you have diabetes, you may get a false-positive result for sugar in your urine. Check with your doctor or health care professional. Birth control pills may not work properly while you are taking this medicine. Talk to your doctor about using an extra method of birth control. What side effects may I notice from receiving this medicine? Side effects that you should report to your doctor or health care  professional as soon as possible: -allergic reactions like skin rash, itching or hives, swelling of the face, lips, or tongue -breathing problems -dark urine -fever or chills, sore throat -redness, blistering, peeling or loosening of the skin, including inside the mouth -seizures -trouble passing urine or change in the amount of urine -unusual bleeding, bruising -unusually weak or tired -white patches or sores in the mouth or throat Side effects that usually do not require medical attention (report to your doctor or health care professional if they continue or are bothersome): -diarrhea -dizziness -headache -nausea, vomiting -stomach upset -vaginal or anal irritation This list may not  describe all possible side effects. Call your doctor for medical advice about side effects. You may report side effects to FDA at 1-800-FDA-1088. Where should I keep my medicine? Keep out of the reach of children. Store at room temperature below 25 degrees C (77 degrees F). Keep container tightly closed. Throw away any unused medicine after the expiration date. NOTE: This sheet is a summary. It may not cover all possible information. If you have questions about this medicine, talk to your doctor, pharmacist, or health care provider.  2019 Elsevier/Gold Standard (2007-06-10 12:04:30)

## 2018-07-15 NOTE — Progress Notes (Addendum)
[facility] Allergies  Allergen Reactions  . Gluten Meal Other (See Comments)    Gi upset  . Doxycycline Other (See Comments)    Significant diarrhea  . Avocado Hives  . Ciprofloxacin     Rash   . Clindamycin     Stomach upset   . Shellfish Allergy Hives  . Iodinated Diagnostic Agents Hives and Itching  . Latex Other (See Comments) and Rash     Virtual Visit via Video Note  I connected with Autumn Bass on 07/15/18 at 11:30 AM EDT by a video enabled telemedicine application and verified that I am speaking with the correct person using two identifiers.   I discussed the limitations of evaluation and management by telemedicine and the availability of in person appointments. The patient expressed understanding and agreed to proceed.  History of Present Illness: Patient requests video virtual visit. She declines coming into the office due to COVID -19. She reports she is unable to take any vital signs where she is.   Patient on virtual visit with provider and patient reports 2-3 days of dysuria, she has had pelvic pain rated 3/10 on pain scale that radiates to lower back. She reports this pain resolves with urination. She also reports burning with urination, urinary frequency and urgency.   She does have a history of having to self catheterize due to endometriosis over 2 years ago. She has not had to do this since hysterectomy and reports this current episode is nothing like the time she had to do this.   She reports she is urinating normal amounts.  She denies any new sexual partners.  She has white vaginal discharge she states " this is my normal discharge". Denies vaginal itching, sores or pain.   She reports she had a hysterectomy 2018 and uterus and left ovary was removed. Right ovary remains.   Patient  denies any fever, body aches,chills, rash, chest pain, shortness of breath, nausea, vomiting, or diarrhea.    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   She does report she took a AZO from over the counter this morning and her symptoms were drastically improved. She rates pain as 0/10 currently. She denies any other symptoms or concerns at this time.  Observations/Objective: Based on Virtual web cam visit:   Patient is alert and oriented and responsive to questions Engages in verbal conversation with provider. Speaks in full sentences without any pauses without any shortness of breath or distress.  She is siting in house and able to get up and down without any pain. Gait is normally. She is smiling without any facial grimacing. Skin color is normal. Breathing appears normal, no retractions.  She is fully clothed. She points to her mid to  right lower pelvis and states its nearer to her pubic hair when she has pain before urination resolves.   Assessment and Plan:  Autumn Bass was seen today for virtual visit for uti.  Diagnoses and all orders for this visit:  Urinary tract infection symptoms- virtual visits   Other orders -     amoxicillin-clavulanate (AUGMENTIN) 500-125 MG tablet; Take 1 tablet (500 mg total) by mouth 2 (two) times daily.  She denies any allergies to Penicillins and has had Amoxicillin for strep this year without any difficulties.  She is advised of above medications and side effects.    Follow Up Instructions:   I discussed the limitations of evaluation and management by telemedicine and  the availability of in person appointments. The patient expressed understanding and agreed to proceed.  Based on your symptoms we will treat for urinary tract infection. If any symptoms change or worsen at anytime or not improving seek emergency medical care immediately. I also recommend follow up with your gynecologist within 1-2 weeks given right ovary still remains after her hysterectomy  and with her complex history and sooner if needed.   [facility] [address] [facility]  [place]  An After Visit Summary was sent to MyChart and reviewed with the patient. She is aware to read her Mychart.  I discussed the assessment and treatment plan with the patient. The patient was provided an opportunity to ask questions and all were answered. The patient agreed with the plan and demonstrated an understanding of the instructions.   Advised patient call the office or your primary care doctor for an appointment if no improvement within 72 hours or if any symptoms change or worsen at any time  Advised ER or urgent Care if after hours or on weekend. Call 911 for emergency symptoms at any time.Patinet verbalized understanding of all instructions given/reviewed and treatment plan and has no further questions or concerns at this time.     The patient was advised to call back or seek an in-person evaluation if the symptoms worsen or if the condition fails to improve as anticipated at any time.   I provided 30 minutes of non-face-to-face time during this encounter.   Jairo BenMichelle Smith Osha Rane, FNP

## 2018-07-21 NOTE — Addendum Note (Signed)
Addended by: Karen Kays on: 07/21/2018 09:49 AM   Modules accepted: Level of Service

## 2018-07-21 NOTE — Addendum Note (Signed)
Addended by: Karen Kays on: 07/21/2018 10:07 AM   Modules accepted: Level of Service

## 2018-08-07 ENCOUNTER — Emergency Department: Payer: No Typology Code available for payment source

## 2018-08-07 ENCOUNTER — Other Ambulatory Visit: Payer: Self-pay

## 2018-08-07 ENCOUNTER — Encounter: Payer: Self-pay | Admitting: Emergency Medicine

## 2018-08-07 ENCOUNTER — Emergency Department
Admission: EM | Admit: 2018-08-07 | Discharge: 2018-08-07 | Disposition: A | Discharge status: Home / Self Care | Payer: No Typology Code available for payment source | Attending: Emergency Medicine | Admitting: Emergency Medicine

## 2018-08-07 DIAGNOSIS — X500XXA Overexertion from strenuous movement or load, initial encounter: Secondary | ICD-10-CM | POA: Diagnosis not present

## 2018-08-07 DIAGNOSIS — Y990 Civilian activity done for income or pay: Secondary | ICD-10-CM | POA: Insufficient documentation

## 2018-08-07 DIAGNOSIS — M5441 Lumbago with sciatica, right side: Secondary | ICD-10-CM | POA: Insufficient documentation

## 2018-08-07 DIAGNOSIS — Z79899 Other long term (current) drug therapy: Secondary | ICD-10-CM | POA: Diagnosis not present

## 2018-08-07 DIAGNOSIS — M545 Low back pain: Secondary | ICD-10-CM | POA: Diagnosis present

## 2018-08-07 DIAGNOSIS — J45909 Unspecified asthma, uncomplicated: Secondary | ICD-10-CM | POA: Insufficient documentation

## 2018-08-07 DIAGNOSIS — Z91040 Latex allergy status: Secondary | ICD-10-CM | POA: Insufficient documentation

## 2018-08-07 DIAGNOSIS — F17200 Nicotine dependence, unspecified, uncomplicated: Secondary | ICD-10-CM | POA: Insufficient documentation

## 2018-08-07 MED ORDER — KETOROLAC TROMETHAMINE 30 MG/ML IJ SOLN
30.0000 mg | Freq: Once | INTRAMUSCULAR | Status: AC
  Administered 2018-08-07: 30 mg via INTRAMUSCULAR
  Filled 2018-08-07: qty 1

## 2018-08-07 MED ORDER — KETOROLAC TROMETHAMINE 30 MG/ML IJ SOLN
30.0000 mg | Freq: Once | INTRAMUSCULAR | Status: AC
  Administered 2018-08-07: 22:00:00 30 mg via INTRAVENOUS
  Filled 2018-08-07: qty 1

## 2018-08-07 MED ORDER — ONDANSETRON HCL 4 MG/2ML IJ SOLN
4.0000 mg | Freq: Once | INTRAMUSCULAR | Status: AC
  Administered 2018-08-07: 21:00:00 4 mg via INTRAVENOUS
  Filled 2018-08-07: qty 2

## 2018-08-07 MED ORDER — MORPHINE SULFATE (PF) 2 MG/ML IV SOLN
2.0000 mg | Freq: Once | INTRAVENOUS | Status: AC
  Administered 2018-08-07: 21:00:00 2 mg via INTRAVENOUS
  Filled 2018-08-07: qty 1

## 2018-08-07 MED ORDER — PREDNISONE 10 MG (21) PO TBPK: ORAL_TABLET | ORAL | 0 refills | Status: AC

## 2018-08-07 MED ORDER — METHOCARBAMOL 500 MG PO TABS: 500.0000 mg | ORAL_TABLET | Freq: Three times a day (TID) | ORAL | 0 refills | Status: AC | PRN

## 2018-08-07 MED ORDER — ORPHENADRINE CITRATE 30 MG/ML IJ SOLN
60.0000 mg | Freq: Two times a day (BID) | INTRAMUSCULAR | Status: DC
  Administered 2018-08-07: 60 mg via INTRAMUSCULAR
  Filled 2018-08-07: qty 2

## 2018-08-07 MED ORDER — LORAZEPAM 1 MG PO TABS
1.0000 mg | ORAL_TABLET | Freq: Once | ORAL | Status: AC
  Administered 2018-08-07: 20:00:00 1 mg via ORAL
  Filled 2018-08-07: qty 1

## 2018-08-07 NOTE — ED Provider Notes (Signed)
[facility] Emergency Department Provider Note  ____________________________________________  Time seen: Approximately 7:48 PM  I have reviewed the triage vital signs and the nursing notes.   HISTORY  Chief Complaint Back Pain    HPI Autumn Bass is a 23 y.o. female presents to the emergency department with acute low back pain with right lower extremity radiculopathy that occurred acutely tonight after she lifted a half empty trash can over a stretcher.  Patient reports that pain was 10 out of 10 in intensity and brought her to her knees.  Patient states that she felt an intense urge to urinate but states that she did not experience incontinence.  Patient states that she has been unable to ambulate since incident occurred.  She has had low back pain in the past but denies any similar symptoms.  Patient describes numbness and tingling along L5 and S1.  No other alleviating measures have been attempted.        Past Medical History:  Diagnosis Date  . Asthma   . Endometriosis   . Endometriosis, bladder    bladder , colon , uterus , appendix    Patient Active Problem List   Diagnosis Date Noted  . Hashimoto's disease 07/14/2017  . Urinary retention 02/06/2016  . Hx of migraine headaches 09/10/2013  . Intractable headache 09/10/2013  . Abnormal uterine bleeding 07/22/2013  . 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 Asthma 02/11/2013  . Endometriosis 02/11/2013  . Hypoglycemia 02/11/2013    Past Surgical History:  Procedure Laterality Date  . ABDOMINAL HYSTERECTOMY    . APPENDECTOMY      Prior to Admission medications   Medication Sig Start Date End Date Taking? Authorizing Provider  acetaminophen (TYLENOL) 500 MG tablet Take by mouth.    [provider]  albuterol (PROVENTIL HFA) 108 (90 Base) MCG/ACT inhaler Inhale 2 puffs into the lungs every 4 (four) hours as needed for wheezing or shortness of breath. 02/08/18   Sharman CheekStafford, Phillip, MD   albuterol (PROVENTIL) (5 MG/ML) 0.5% nebulizer solution Inhale into the lungs.    [provider]  amoxicillin-clavulanate (AUGMENTIN) 500-125 MG tablet Take 1 tablet (500 mg total) by mouth 2 (two) times daily. 07/15/18   Flinchum, Eula FriedMichelle S, FNP  cetirizine (ZYRTEC) 10 MG tablet Take by mouth.    [provider]  cyclobenzaprine (FLEXERIL) 5 MG tablet  01/14/18   [provider]  dicyclomine (BENTYL) 10 MG capsule Take 10 mg by mouth as directed.    [provider]  fluticasone (FLONASE) 50 MCG/ACT nasal spray Place 1 spray into both nostrils daily. 12/28/17   Candis SchatzHarris, Michael D, PA-C  linaclotide [facility](LINZESS) 145 MCG CAPS capsule Take by mouth. 02/23/18   [provider]  Lubiprostone (AMITIZA PO) Take by mouth.    [provider]  methocarbamol (ROBAXIN) 500 MG tablet Take 1 tablet (500 mg total) by mouth every 8 (eight) hours as needed for up to 5 days. 08/07/18 08/12/18  Orvil FeilWoods, Lorre Opdahl M, PA-C  PARoxetine (PAXIL) 20 MG tablet Take by mouth. 04/02/17   [provider]  polyethylene glycol-electrolytes (NULYTELY/GOLYTELY) 420 g solution May substitute with GaviLyte G Nulytely TriLyte Take as directed. 02/15/18   [provider]  predniSONE (STERAPRED UNI-PAK 21 TAB) 10 MG (21) TBPK tablet Take 6 tabs the the 1st day. Take 6 tabs the the 2nd day. Take 5 tabs the the 3rd day. Take 5 tabs the 4th day. Take 4 tabs the the 5th day.Take 4 tabs the the 6th  day.Take 3 tabs the 7th day.Take 3 tabs the 8th day. Take 2 tabs the 9th day. Take 2 tabs the 10th day. Take 1 tab the 11th day. Take 1 tab the 12th day. 08/07/18   Orvil Feil, PA-C  promethazine (PHENERGAN) 12.5 MG tablet Take 1 tablet (12.5 mg total) by mouth every 6 (six) hours as needed for nausea or vomiting. 05/15/18   Jene Every, MD    Allergies Gluten meal; Doxycycline; Avocado; Ciprofloxacin; Clindamycin; Shellfish allergy; Iodinated diagnostic agents; and Latex  No family  history on file.  Social History Social History   Tobacco Use  . Smoking status: Current Every Day Smoker  . Smokeless tobacco: Never Used  Substance Use Topics  . Alcohol use: Yes    Frequency: Never  . Drug use: Never     Review of Systems  Constitutional: No fever/chills Eyes: No visual changes. No discharge ENT: No upper respiratory complaints. Cardiovascular: no chest pain. Respiratory: no cough. No SOB. Gastrointestinal: No abdominal pain.  No nausea, no vomiting.  No diarrhea.  No constipation. Genitourinary: Negative for dysuria. No hematuria Musculoskeletal: Patient has low back pain.  Skin: Negative for rash, abrasions, lacerations, ecchymosis. Neurological: Patient has numbness and tingling of right lower extremity.    ____________________________________________   PHYSICAL EXAM:  VITAL SIGNS: ED Triage Vitals  Enc Vitals Group     BP 08/07/18 1747 (!) 107/55     Pulse Rate 08/07/18 1747 79     Resp 08/07/18 1747 18     Temp 08/07/18 1747 97.8 F (36.6 C)     Temp Source 08/07/18 1747 Oral     SpO2 08/07/18 1747 98 %     Weight 08/07/18 1749 137 lb (62.1 kg)     Height 08/07/18 1749  (1.6 m)     Head Circumference --      Peak Flow --      Pain Score 08/07/18 1749 7     Pain Loc --      Pain Edu? --      Excl. in GC? --      Constitutional: Alert and oriented. Well appearing and in no acute distress. Eyes: Conjunctivae are normal. PERRL. EOMI. Head: Atraumatic. Cardiovascular: Normal rate, regular rhythm. Normal S1 and S2.  Good peripheral circulation. Respiratory: Normal respiratory effort without tachypnea or retractions. Lungs CTAB. Good air entry to the bases with no decreased or absent breath sounds. Gastrointestinal: Bowel sounds 4 quadrants. Soft and nontender to palpation. No guarding or rigidity. No palpable masses. No distention. No CVA tenderness. Musculoskeletal: Patient has 4 out of 5 strength of the right great toe.  Strength  is otherwise symmetric of the lower extremities bilaterally.  She has paresthesias along L5-S1 and L3 dermatomes.  Palpable dorsalis pedis pulse bilaterally and symmetrically. Neurologic:  Normal speech and language. No gross focal neurologic deficits are appreciated.  Skin:  Skin is warm, dry and intact. No rash noted. Psychiatric: Mood and affect are normal. Speech and behavior are normal. Patient exhibits appropriate insight and judgement.   ____________________________________________   LABS (all labs ordered are listed, but only abnormal results are displayed)  Labs Reviewed - No data to display ____________________________________________  EKG   ____________________________________________  RADIOLOGY I personally viewed and evaluated these images as part of my medical decision making, as well as reviewing the written report by the radiologist.  Dg Lumbar Spine 2-3 Views  Result Date: 08/07/2018 CLINICAL DATA:  Back pain while lifting, radiating, bowel symptoms  EXAM: LUMBAR SPINE - 2-3 VIEW COMPARISON:  None. FINDINGS: There is no evidence of lumbar spine fracture. Alignment is normal. Intervertebral disc spaces are maintained. IMPRESSION: No fracture or dislocation of the lumbar spine. Disc spaces are preserved. Consider MRI to further evaluate lumbar disc and neural foraminal pathology if suspected based on localizing signs and symptoms. Electronically Signed   By: Lauralyn Primes M.D.   On: 08/07/2018 19:23   Mr Lumbar Spine Wo Contrast  Result Date: 08/07/2018 CLINICAL DATA:  23 year old female with sudden onset back pain radiating to the right leg after lifting. Right foot numbness. EXAM: MRI LUMBAR SPINE WITHOUT CONTRAST TECHNIQUE: Multiplanar, multisequence MR imaging of the lumbar spine was performed. No intravenous contrast was administered. COMPARISON:  Lumbar radiographs earlier today. CT Abdomen and Pelvis 05/15/2018. FINDINGS: Segmentation:  Normal as seen on the comparisons.  Alignment: Very mild levoconvex lumbar curvature as seen on the earlier radiographs. No spondylolisthesis. Vertebrae: No marrow edema or evidence of acute osseous abnormality. Visualized bone marrow signal is within normal limits. Negative visible sacrum and SI joints. Conus medullaris and cauda equina: Conus extends to the L1 level. No lower spinal cord or conus signal abnormality. Paraspinal and other soft tissues: Physiologic appearing right ovarian follicles/cysts. Negative visible abdominal viscera and paraspinal soft tissues. Disc levels: T11-T12: Negative. T12-L1:  Negative. L1-L2:  Negative. L2-L3:  Negative. L3-L4:  Negative. L4-L5: Mild disc desiccation and far lateral disc bulging. Borderline to mild facet hypertrophy. No stenosis. L5-S1:  Negative. IMPRESSION: Minimal disc and facet degeneration at L4-L5. No lumbar disc herniation, spinal stenosis, or convincing neural impingement. Electronically Signed   By: Odessa Fleming M.D.   On: 08/07/2018 21:17    ____________________________________________    PROCEDURES  Procedure(s) performed:    Procedures    Medications  ketorolac (TORADOL) 30 MG/ML injection 30 mg (30 mg Intramuscular Given 08/07/18 1812)  morphine 2 MG/ML injection 2 mg (2 mg Intravenous Given 08/07/18 2047)  ondansetron (ZOFRAN) injection 4 mg (4 mg Intravenous Given 08/07/18 2047)  LORazepam (ATIVAN) tablet 1 mg (1 mg Oral Given 08/07/18 2011)  ketorolac (TORADOL) 30 MG/ML injection 30 mg (30 mg Intravenous Given 08/07/18 2214)     ____________________________________________   INITIAL IMPRESSION / ASSESSMENT AND PLAN / ED COURSE  Pertinent labs & imaging results that were available during my care of the patient were reviewed by me and considered in my medical decision making (see chart for details).  Review of the [place] CSRS was performed in accordance of the [facility] prior to dispensing any controlled drugs.         Assessment and Plan:  Low back pain 23 year old female  presents to the emergency department with acute 10 out of 10 low back pain with right lower extremity radiculopathy  On physical exam, patient had some mild right great toe weakness and paresthesias along the L5-S1 dermatomes.  Differential diagnosis include lumbar strain, disc herniation and sciatica  X-rays of the lumbar spine were obtained which revealed no acute bony abnormality.  MRI revealed mild disc desiccation and bulging but no herniation.  Patient was discharged with tapered prednisone and Robaxin.  She was advised to follow-up with Dr. Marcell Barlow.  A work note was provided.  All patient questions were answered.  ____________________________________________  FINAL CLINICAL IMPRESSION(S) / ED DIAGNOSES  Final diagnoses:  Acute right-sided low back pain with right-sided sciatica      NEW MEDICATIONS STARTED DURING THIS VISIT:  ED Discharge Orders  Ordered    predniSONE (STERAPRED UNI-PAK 21 TAB) 10 MG (21) TBPK tablet     08/07/18 2140    methocarbamol (ROBAXIN) 500 MG tablet  Every 8 hours PRN     08/07/18 2140              This chart was dictated using voice recognition software/Dragon. Despite best efforts to proofread, errors can occur which can change the meaning. Any change was purely unintentional.    Orvil Feil, PA-C 08/07/18 2217    Emily Filbert, MD 08/07/18 2240

## 2018-08-07 NOTE — ED Triage Notes (Signed)
Low back pain radiating to R leg after twist and lit at 1445. Was on duty EMS.

## 2018-08-07 NOTE — ED Notes (Signed)
This is a worker's comp case. Printed off the profile for pt's company - it states nothing is required unless it is an mva, which this is not.

## 2018-08-07 NOTE — ED Notes (Signed)
Pt was lifting a trash can and twisted at the waist and experienced sudden back pain that radiates into R leg. R foot numb; pt states has been getting steadily worse. Dorsalis pedis pulse on R foot 2+. Lower back pain/pressure. Appropriate warmth/color to R leg.

## 2018-09-08 ENCOUNTER — Encounter: Payer: Self-pay | Admitting: Adult Health

## 2020-01-02 IMAGING — CR LUMBAR SPINE - 2-3 VIEW
3 series · 3 of 3 positions shown · non-contrast
Comparison: None.

CLINICAL DATA: Back pain while lifting, radiating, bowel symptoms

EXAM:
LUMBAR SPINE - 2-3 VIEW

[l-spine ap]
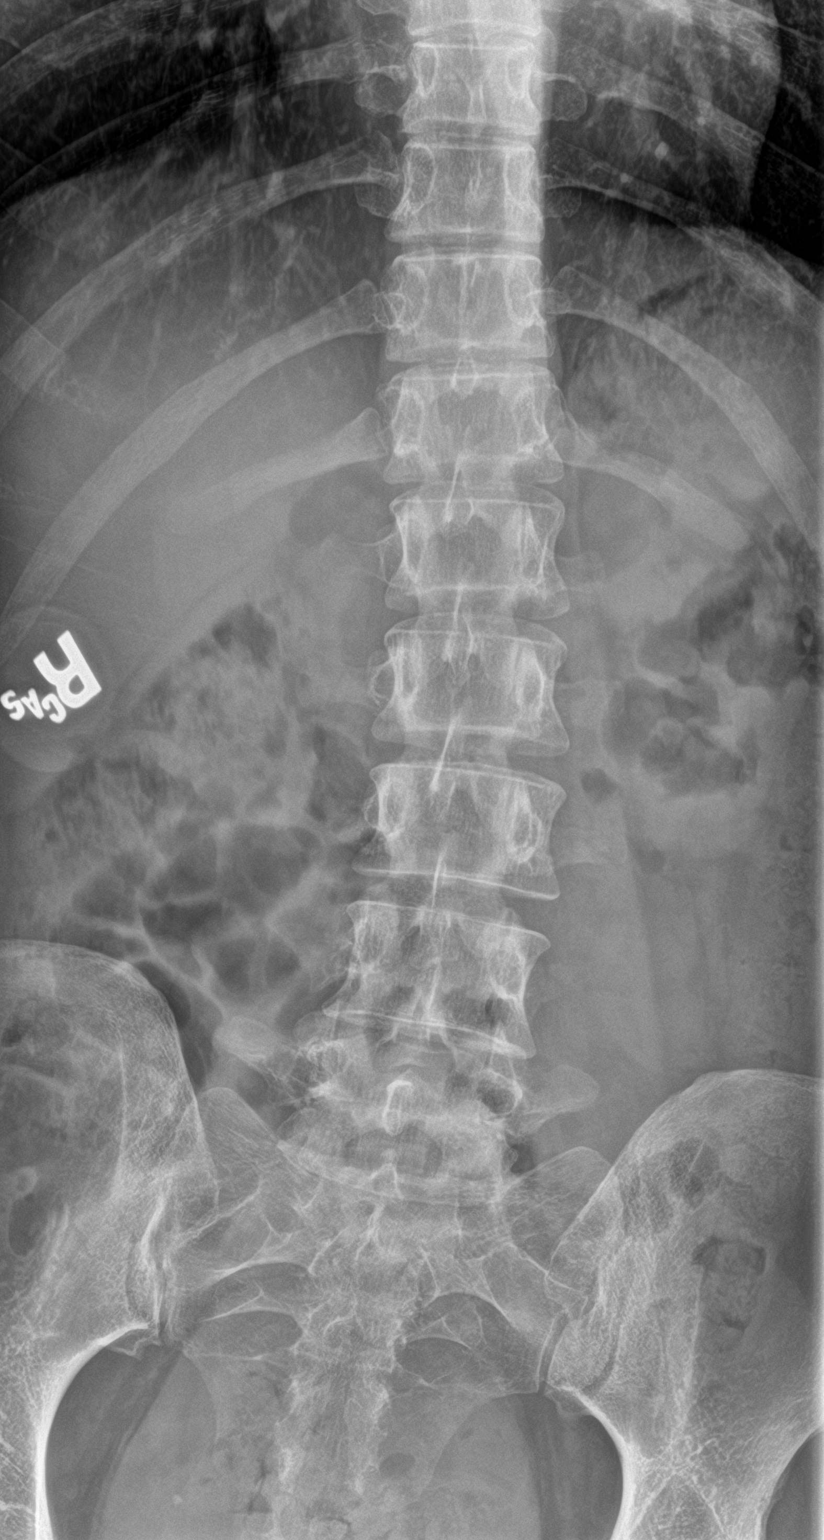

[l-spine lat]
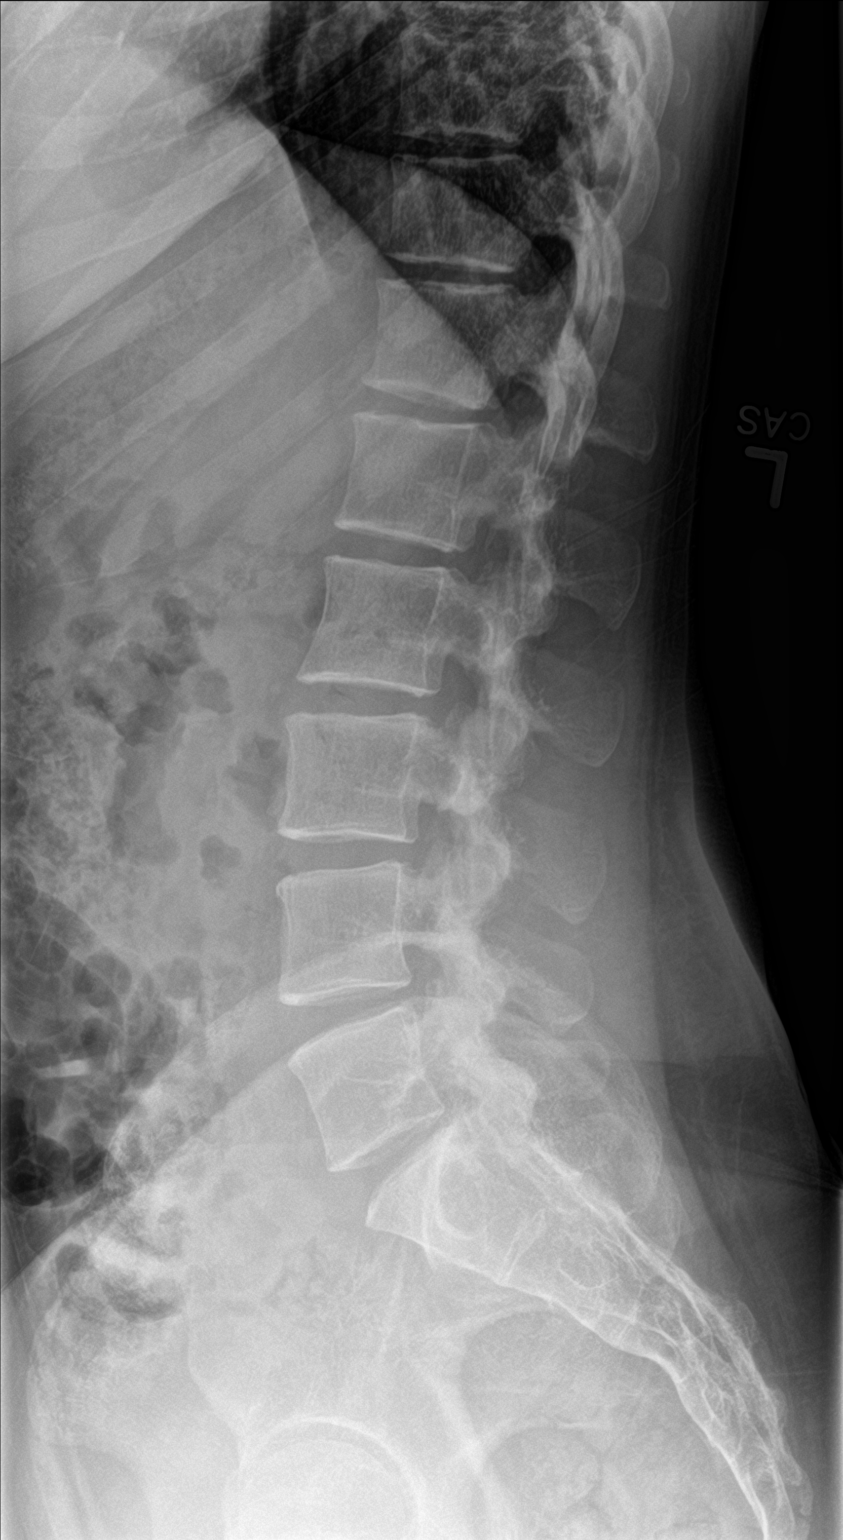

[l-spine spot]
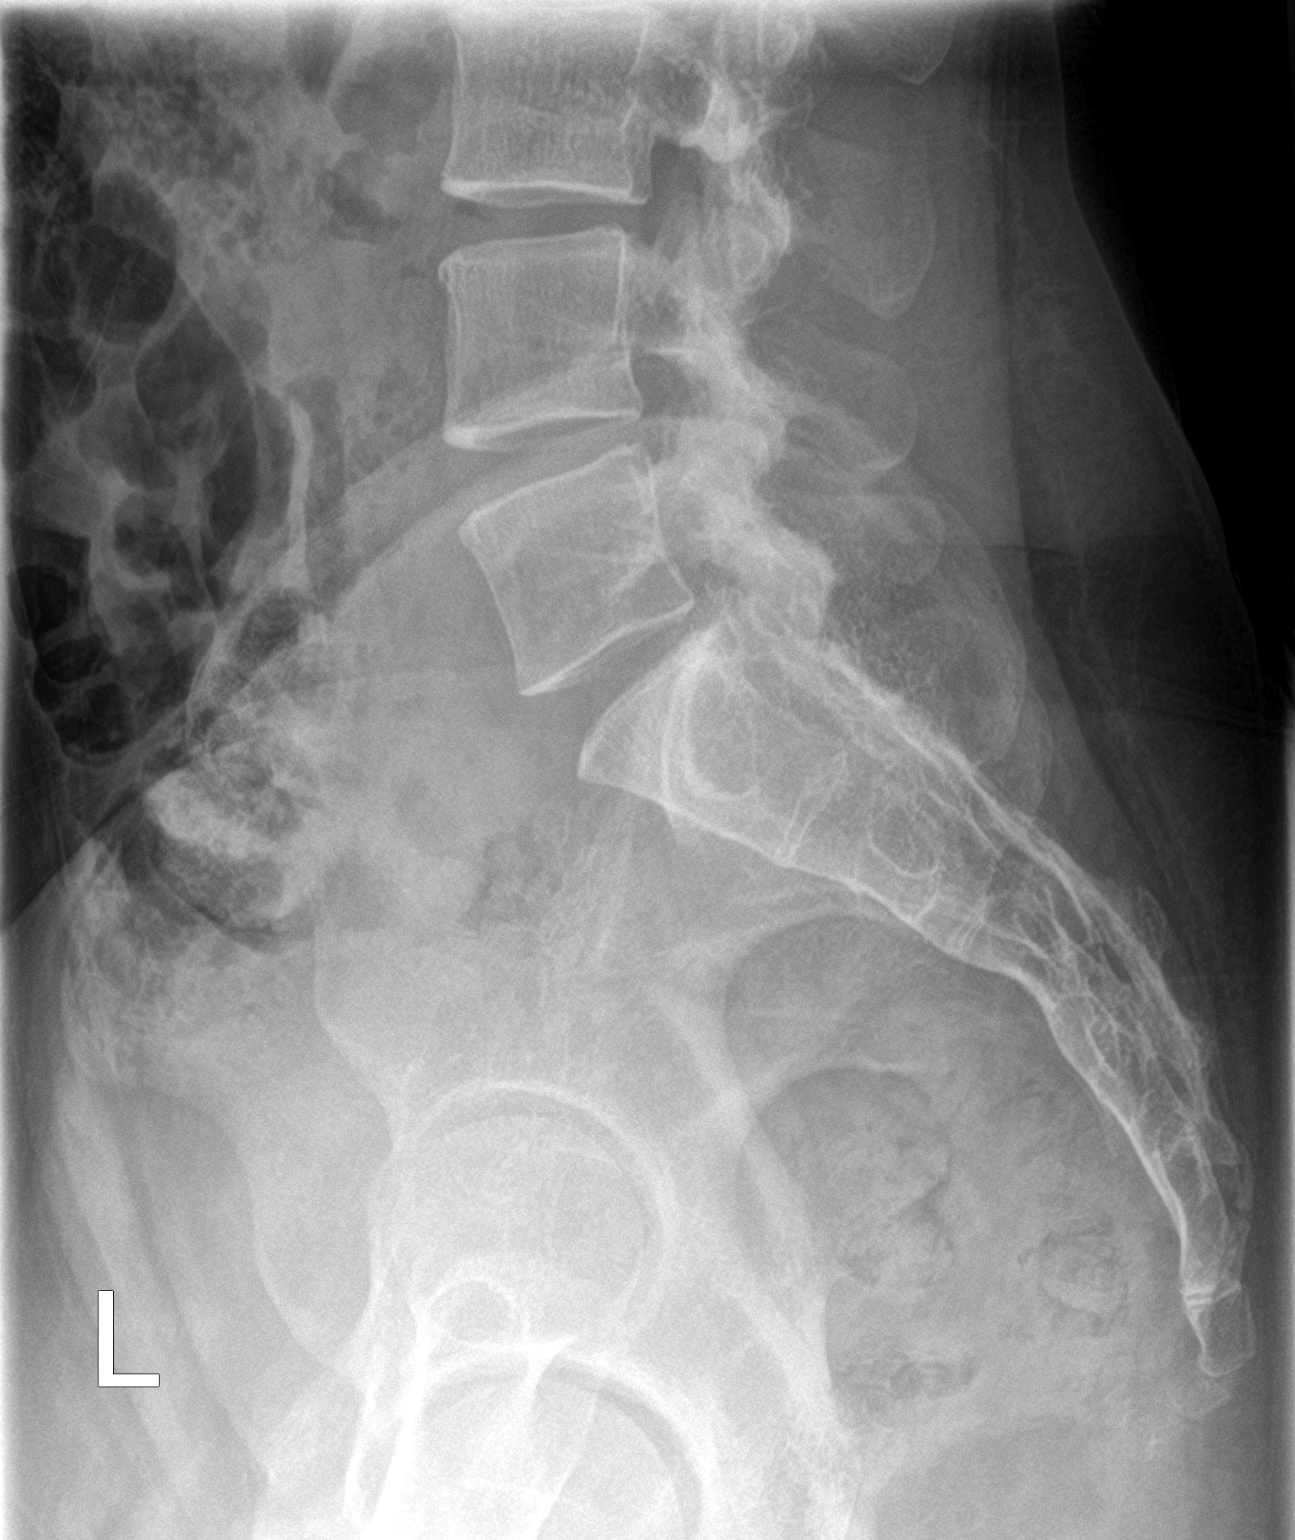

[3 of 3 positions shown; findings below may reference images not displayed]

FINDINGS: There is no evidence of lumbar spine fracture. Alignment is normal.
Intervertebral disc spaces are maintained.
IMPRESSION: No fracture or dislocation of the lumbar spine. Disc spaces are
preserved. Consider MRI to further evaluate lumbar disc and neural
foraminal pathology if suspected based on localizing signs and
symptoms.

## 2020-01-02 IMAGING — MR MRI LUMBAR SPINE WITHOUT CONTRAST
5 series · 32 of 48 positions shown · non-contrast
Comparison: Lumbar radiographs earlier today. CT Abdomen and Pelvis
05/15/2018.

CLINICAL DATA: 23-year-old female with sudden onset back pain
radiating to the right leg after lifting. Right foot numbness.

EXAM:
MRI LUMBAR SPINE WITHOUT CONTRAST
TECHNIQUE: Multiplanar, multisequence MR imaging of the lumbar spine was
performed. No intravenous contrast was administered.

[Series 11: T2 · sagittal · 4.0mm · 0.81mm/px · 6 of 17 slices shown (1 of 2)]
[im 1/17]
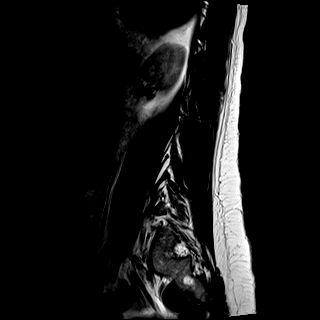
[im 4/17]
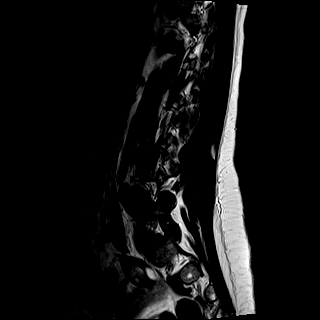
[im 7/17]
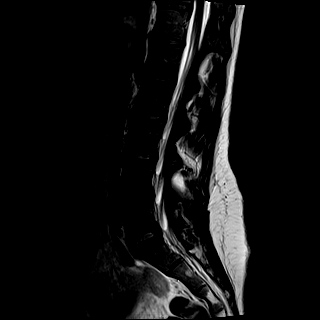
[im 10/17]
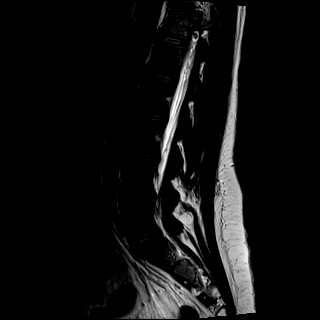
[im 13/17]
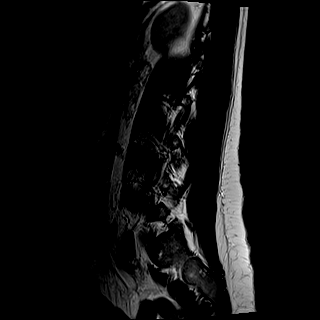
[im 17/17]
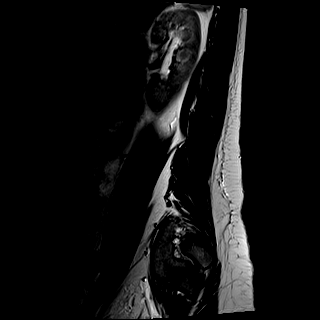

[Series 12: T1 · sagittal · 4.0mm · 0.81mm/px · 7 of 17 slices shown (1 of 2)]
[im 1/17]
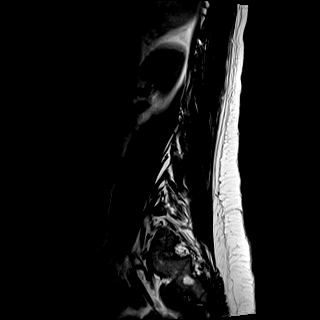
[im 3/17]
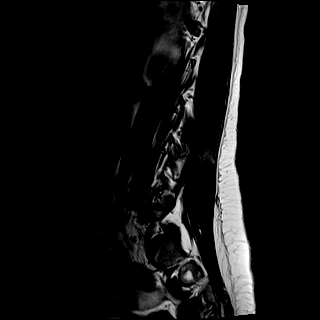
[im 6/17]
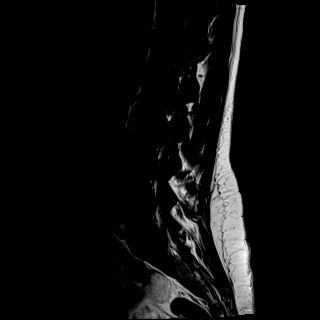
[im 9/17]
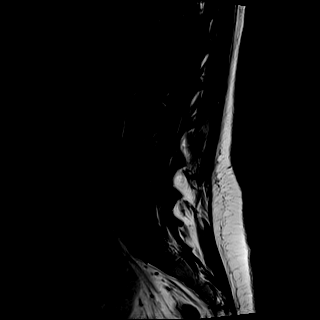
[im 11/17]
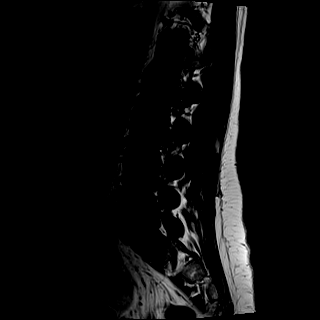
[im 14/17]
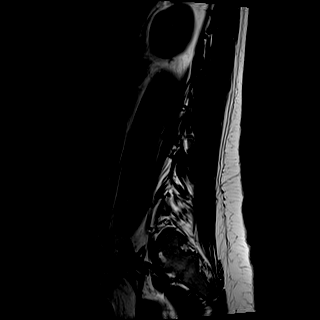
[im 17/17]
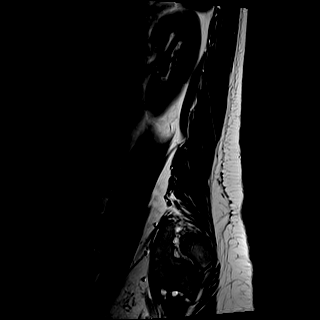

[Series 13: STIR · sagittal · 4.0mm · 0.51mm/px · 3 of 17 slices shown]
[im 1/17]
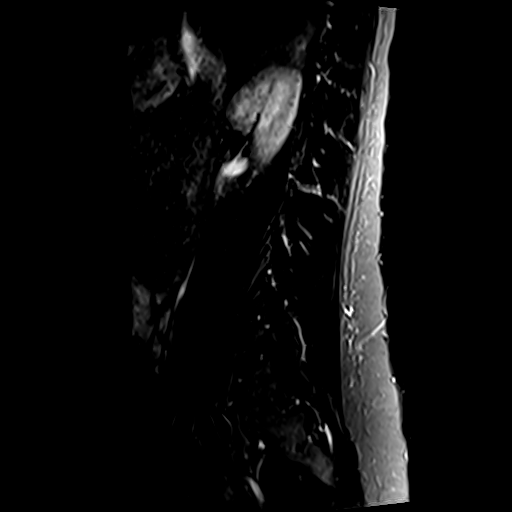
[im 3/17]
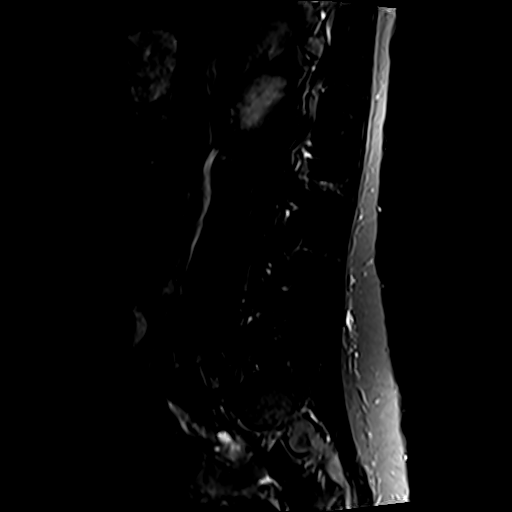
[im 6/17]
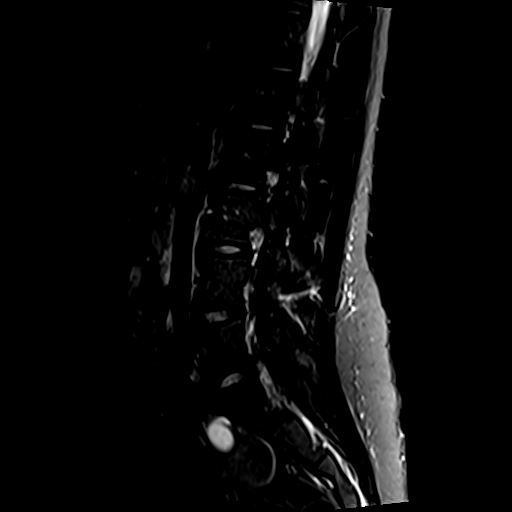

[Series 14: T2 · axial · 4.0mm · 0.78mm/px · z∈[-128,+86]mm · 8 of 36 slices shown (2 of 2)]
[im 1/36]
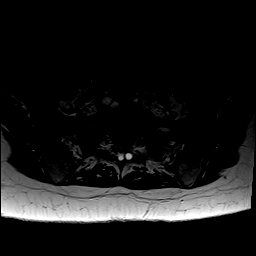
[im 6/36]
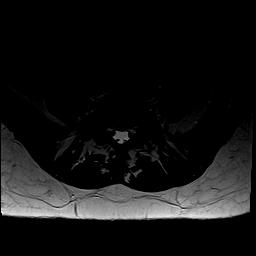
[im 11/36]
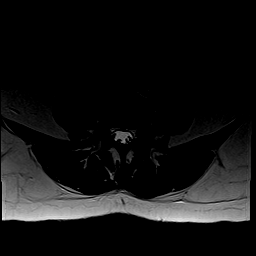
[im 17/36]
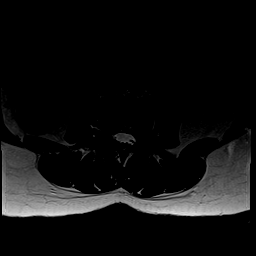
[im 19/36]
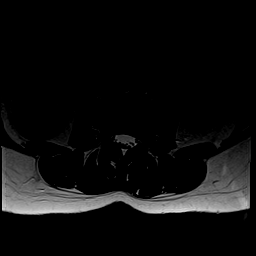
[im 25/36]
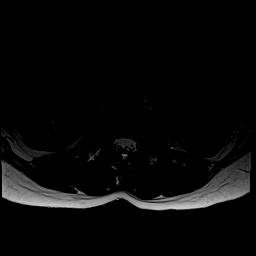
[im 30/36]
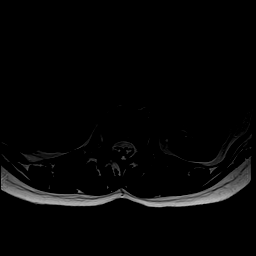
[im 36/36]
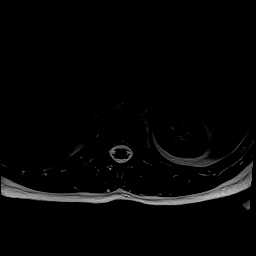

[Series 15: T1 · axial · 4.0mm · 0.39mm/px · z∈[-128,+86]mm · 8 of 36 slices shown (2 of 2)]
[im 1/36]
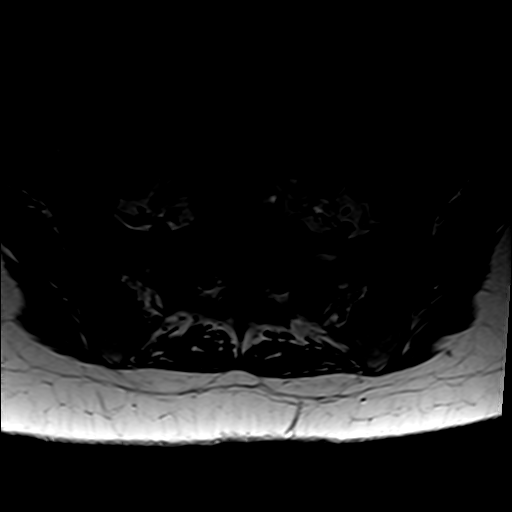
[im 6/36]
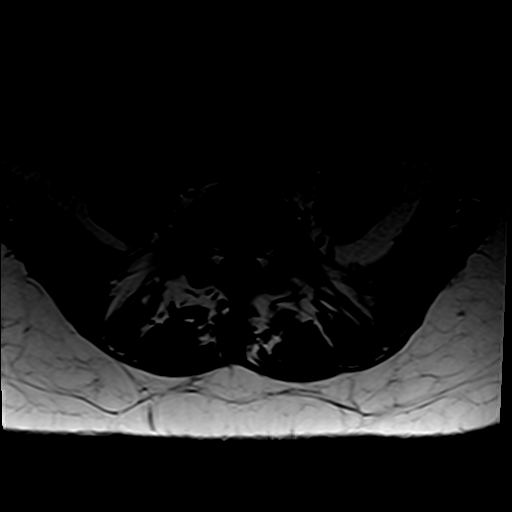
[im 11/36]
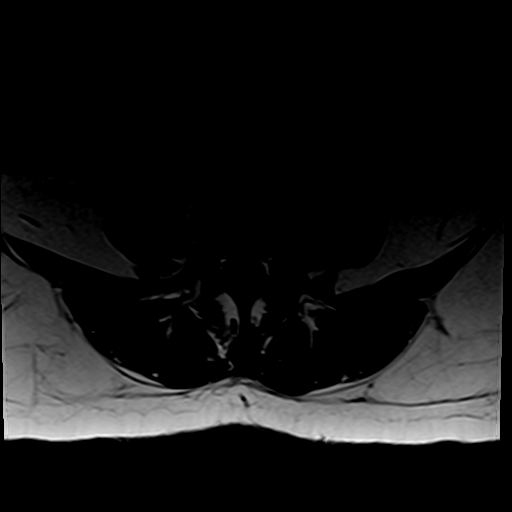
[im 17/36]
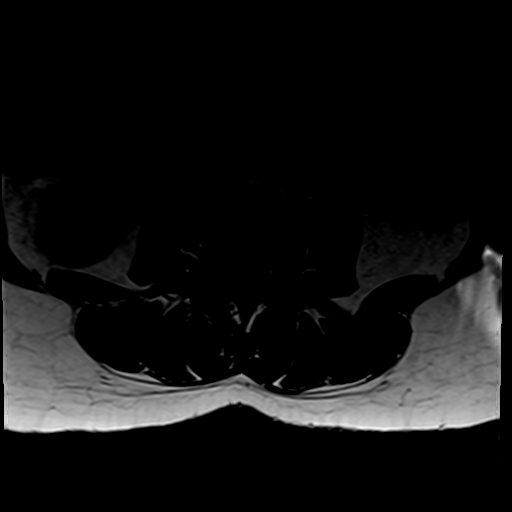
[im 19/36]
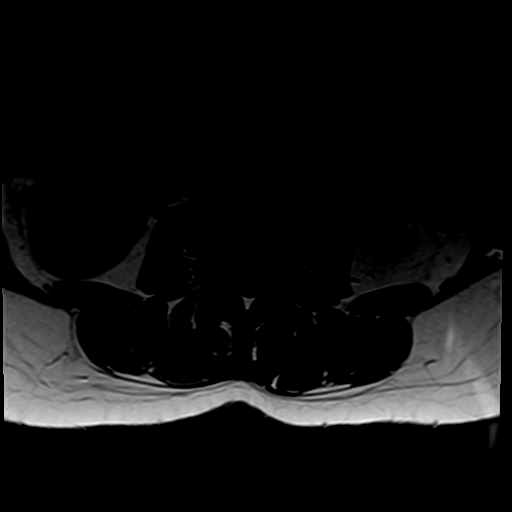
[im 25/36]
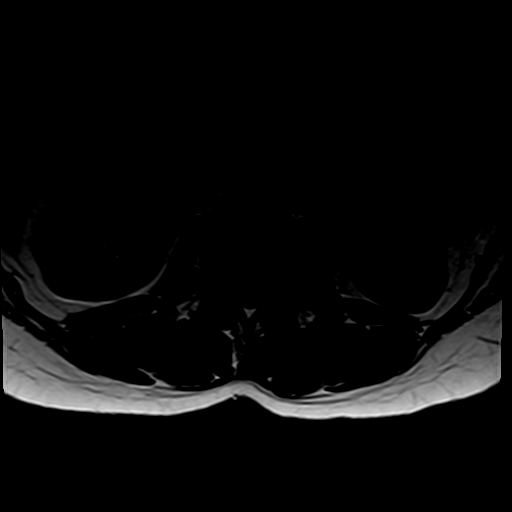
[im 30/36]
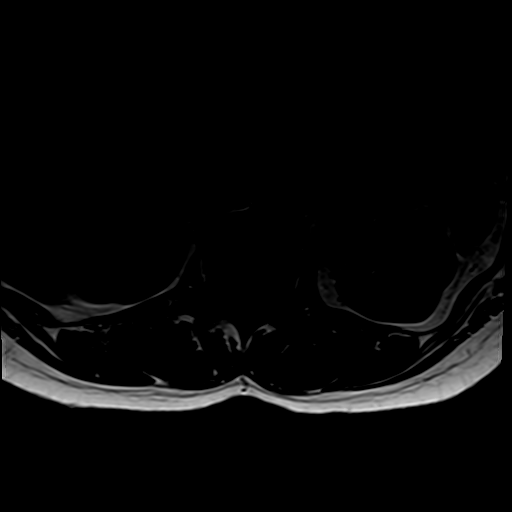
[im 36/36]
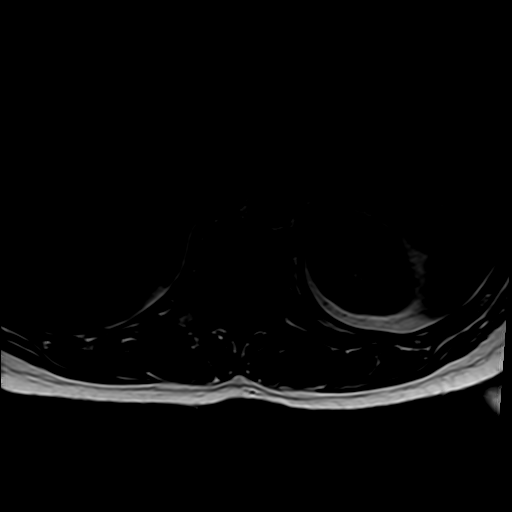

[32 of 48 positions shown; findings below may reference images not displayed]

FINDINGS: Segmentation:  Normal as seen on the comparisons.

Alignment: Very mild levoconvex lumbar curvature as seen on the
earlier radiographs. No spondylolisthesis.

Vertebrae: No marrow edema or evidence of acute osseous abnormality.
Visualized bone marrow signal is within normal limits. Negative
visible sacrum and SI joints.

Conus medullaris and cauda equina: Conus extends to the L1 level. No
lower spinal cord or conus signal abnormality.

Paraspinal and other soft tissues: Physiologic appearing right
ovarian follicles/cysts. Negative visible abdominal viscera and
paraspinal soft tissues.

Disc levels:

T11-T12: Negative.

T12-L1:  Negative.

L1-L2:  Negative.

L2-L3:  Negative.

L3-L4:  Negative.

L4-L5: Mild disc desiccation and far lateral disc bulging.
Borderline to mild facet hypertrophy. No stenosis.

L5-S1:  Negative.
IMPRESSION: Minimal disc and facet degeneration at L4-L5.

No lumbar disc herniation, spinal stenosis, or convincing neural
impingement.
